# Patient Record
Sex: Male | Born: 1980
Health system: Southern US, Academic
[De-identification: ages and names within clinical notes are randomized; demographics above are authoritative.]

## PROBLEM LIST (undated history)

## (undated) ENCOUNTER — Ambulatory Visit
Payer: MEDICARE | Attending: Rehabilitative and Restorative Service Providers" | Primary: Rehabilitative and Restorative Service Providers"

## (undated) ENCOUNTER — Encounter

## (undated) ENCOUNTER — Encounter: Attending: Neurology | Primary: Neurology

## (undated) ENCOUNTER — Ambulatory Visit

## (undated) ENCOUNTER — Telehealth

## (undated) ENCOUNTER — Ambulatory Visit: Payer: MEDICARE

## (undated) ENCOUNTER — Ambulatory Visit: Payer: Medicare (Managed Care) | Attending: Neurology | Primary: Neurology

## (undated) ENCOUNTER — Ambulatory Visit: Payer: MEDICARE | Attending: Neurology | Primary: Neurology

## (undated) ENCOUNTER — Ambulatory Visit: Payer: Medicare (Managed Care)

## (undated) ENCOUNTER — Other Ambulatory Visit

## (undated) ENCOUNTER — Telehealth: Attending: Neurology | Primary: Neurology

## (undated) ENCOUNTER — Ambulatory Visit: Payer: MEDICARE | Attending: Ophthalmology | Primary: Ophthalmology

## (undated) ENCOUNTER — Ambulatory Visit: Attending: Addiction (Substance Use Disorder) | Primary: Addiction (Substance Use Disorder)

## (undated) ENCOUNTER — Encounter: Attending: Addiction (Substance Use Disorder) | Primary: Addiction (Substance Use Disorder)

## (undated) ENCOUNTER — Telehealth: Attending: Clinical | Primary: Clinical

## (undated) ENCOUNTER — Ambulatory Visit: Attending: Pharmacist | Primary: Pharmacist

---
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
## 2007-03-14 ENCOUNTER — Emergency Department: Payer: Self-pay | Admitting: Emergency Medicine
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
## 2017-06-04 ENCOUNTER — Emergency Department
Admission: EM | Admit: 2017-06-04 | Discharge: 2017-06-04 | Disposition: A | Discharge status: Home / Self Care | Payer: MEDICARE | Source: Intra-hospital

## 2017-06-21 ENCOUNTER — Ambulatory Visit
Admission: RE | Admit: 2017-06-21 | Discharge: 2017-06-21 | Payer: MEDICARE | Attending: Student in an Organized Health Care Education/Training Program | Admitting: Student in an Organized Health Care Education/Training Program

## 2017-06-21 DIAGNOSIS — G43709 Chronic migraine without aura, not intractable, without status migrainosus: Principal | ICD-10-CM

## 2017-06-21 DIAGNOSIS — M542 Cervicalgia: Secondary | ICD-10-CM

## 2017-06-21 DIAGNOSIS — G8929 Other chronic pain: Secondary | ICD-10-CM

## 2017-06-21 DIAGNOSIS — I10 Essential (primary) hypertension: Secondary | ICD-10-CM

## 2017-06-21 MED ORDER — NAPROXEN 500 MG TABLET,DELAYED RELEASE
ORAL_TABLET | Freq: Two times a day (BID) | ORAL | 6 refills | 0 days | Status: CP
Start: 2017-06-21 — End: 2017-08-08

## 2017-06-21 MED ORDER — RIZATRIPTAN 10 MG DISINTEGRATING TABLET
ORAL_TABLET | Freq: Once | ORAL | 3 refills | 0 days | Status: CP | PRN
Start: 2017-06-21 — End: 2017-08-08

## 2017-06-21 MED ORDER — PROPRANOLOL 40 MG TABLET
ORAL_TABLET | Freq: Two times a day (BID) | ORAL | 3 refills | 0 days | Status: CP
Start: 2017-06-21 — End: 2017-08-08

## 2017-06-26 ENCOUNTER — Emergency Department
Admission: EM | Admit: 2017-06-26 | Discharge: 2017-06-26 | Disposition: A | Discharge status: Home / Self Care | Source: Intra-hospital

## 2017-07-08 ENCOUNTER — Emergency Department
Admission: EM | Admit: 2017-07-08 | Discharge: 2017-07-08 | Disposition: A | Discharge status: Home / Self Care | Payer: MEDICARE | Source: Intra-hospital | Attending: Family | Admitting: Family

## 2017-08-02 ENCOUNTER — Emergency Department
Admission: EM | Admit: 2017-08-02 | Discharge: 2017-08-02 | Disposition: A | Discharge status: Home / Self Care | Payer: MEDICARE | Source: Intra-hospital | Attending: Registered Nurse | Admitting: Registered Nurse

## 2017-08-08 ENCOUNTER — Ambulatory Visit
Admission: RE | Admit: 2017-08-08 | Discharge: 2017-08-08 | Attending: Student in an Organized Health Care Education/Training Program

## 2017-08-08 DIAGNOSIS — I10 Essential (primary) hypertension: Secondary | ICD-10-CM

## 2017-08-08 DIAGNOSIS — G8929 Other chronic pain: Secondary | ICD-10-CM

## 2017-08-08 DIAGNOSIS — M542 Cervicalgia: Secondary | ICD-10-CM

## 2017-08-08 DIAGNOSIS — G43709 Chronic migraine without aura, not intractable, without status migrainosus: Principal | ICD-10-CM

## 2017-08-08 MED ORDER — RIZATRIPTAN 10 MG DISINTEGRATING TABLET
ORAL_TABLET | Freq: Once | ORAL | 3 refills | 0.00000 days | Status: CP | PRN
Start: 2017-08-08 — End: 2017-09-25

## 2017-08-08 MED ORDER — PROPRANOLOL 80 MG TABLET
ORAL_TABLET | Freq: Two times a day (BID) | ORAL | 3 refills | 0 days | Status: SS
Start: 2017-08-08 — End: 2017-12-07

## 2017-08-08 MED ORDER — NAPROXEN 500 MG TABLET,DELAYED RELEASE
ORAL_TABLET | Freq: Two times a day (BID) | ORAL | 6 refills | 0 days | Status: CP
Start: 2017-08-08 — End: 2017-09-30

## 2017-08-16 ENCOUNTER — Emergency Department
Admission: EM | Admit: 2017-08-16 | Discharge: 2017-08-16 | Disposition: A | Discharge status: Home / Self Care | Payer: MEDICARE | Source: Intra-hospital

## 2017-08-28 ENCOUNTER — Emergency Department
Admission: EM | Admit: 2017-08-28 | Discharge: 2017-08-29 | Disposition: A | Discharge status: Home / Self Care | Payer: MEDICARE | Source: Intra-hospital | Attending: Emergency Medicine | Admitting: Emergency Medicine

## 2017-09-25 ENCOUNTER — Ambulatory Visit
Admission: RE | Admit: 2017-09-25 | Discharge: 2017-09-25 | Disposition: A | Discharge status: Home / Self Care | Payer: MEDICARE | Attending: Neurology | Admitting: Neurology

## 2017-09-25 ENCOUNTER — Ambulatory Visit
Admission: RE | Admit: 2017-09-25 | Discharge: 2017-09-25 | Disposition: A | Discharge status: Home / Self Care | Payer: MEDICARE

## 2017-09-25 DIAGNOSIS — G43709 Chronic migraine without aura, not intractable, without status migrainosus: Principal | ICD-10-CM

## 2017-09-25 DIAGNOSIS — D649 Anemia, unspecified: Secondary | ICD-10-CM

## 2017-09-25 DIAGNOSIS — I10 Essential (primary) hypertension: Secondary | ICD-10-CM

## 2017-09-25 DIAGNOSIS — G8929 Other chronic pain: Secondary | ICD-10-CM

## 2017-09-25 DIAGNOSIS — M542 Cervicalgia: Secondary | ICD-10-CM

## 2017-09-25 DIAGNOSIS — F79 Unspecified intellectual disabilities: Secondary | ICD-10-CM

## 2017-09-25 MED ORDER — RIZATRIPTAN 10 MG DISINTEGRATING TABLET
ORAL_TABLET | Freq: Once | ORAL | 3 refills | 0 days | Status: CP | PRN
Start: 2017-09-25 — End: 2017-12-29

## 2017-09-25 MED ORDER — METHYLPREDNISOLONE 4 MG TABLETS IN A DOSE PACK
PACK | 0 refills | 0 days | Status: CP
Start: 2017-09-25 — End: 2017-12-07

## 2017-09-25 MED ORDER — ONDANSETRON 8 MG DISINTEGRATING TABLET
ORAL_TABLET | Freq: Three times a day (TID) | ORAL | 2 refills | 0 days | Status: CP | PRN
Start: 2017-09-25 — End: 2017-10-25

## 2017-09-25 MED ORDER — TOPIRAMATE 50 MG TABLET
ORAL_TABLET | 0 refills | 0 days | Status: CP
Start: 2017-09-25 — End: 2017-12-04

## 2017-09-29 ENCOUNTER — Ambulatory Visit
Admission: RE | Admit: 2017-09-29 | Discharge: 2017-09-29 | Payer: MEDICARE | Attending: Neurology | Admitting: Neurology

## 2017-09-29 DIAGNOSIS — G43709 Chronic migraine without aura, not intractable, without status migrainosus: Principal | ICD-10-CM

## 2017-09-30 ENCOUNTER — Emergency Department
Admission: EM | Admit: 2017-09-30 | Discharge: 2017-09-30 | Disposition: A | Discharge status: Home / Self Care | Payer: MEDICARE | Source: Intra-hospital | Attending: Emergency Medicine | Admitting: Emergency Medicine

## 2017-09-30 ENCOUNTER — Emergency Department
Admission: EM | Admit: 2017-09-30 | Discharge: 2017-09-30 | Disposition: A | Discharge status: Home / Self Care | Payer: MEDICARE | Source: Intra-hospital

## 2017-09-30 DIAGNOSIS — M545 Low back pain: Principal | ICD-10-CM

## 2017-09-30 MED ORDER — NAPROXEN 500 MG TABLET
ORAL_TABLET | Freq: Two times a day (BID) | ORAL | 0 refills | 0.00000 days | Status: CP
Start: 2017-09-30 — End: 2017-10-15

## 2017-09-30 MED ORDER — TIZANIDINE 2 MG TABLET
ORAL_TABLET | Freq: Four times a day (QID) | ORAL | 0 refills | 0.00000 days | Status: CP | PRN
Start: 2017-09-30 — End: 2017-10-06

## 2017-10-06 ENCOUNTER — Ambulatory Visit: Admission: RE | Admit: 2017-10-06 | Discharge: 2017-10-06 | Payer: MEDICARE | Attending: Internal Medicine

## 2017-10-06 DIAGNOSIS — G8929 Other chronic pain: Secondary | ICD-10-CM

## 2017-10-06 DIAGNOSIS — H538 Other visual disturbances: Secondary | ICD-10-CM

## 2017-10-06 DIAGNOSIS — M542 Cervicalgia: Secondary | ICD-10-CM

## 2017-10-06 DIAGNOSIS — G43709 Chronic migraine without aura, not intractable, without status migrainosus: Secondary | ICD-10-CM

## 2017-10-06 DIAGNOSIS — I10 Essential (primary) hypertension: Principal | ICD-10-CM

## 2017-10-06 MED ORDER — TIZANIDINE 2 MG TABLET
ORAL_TABLET | Freq: Four times a day (QID) | ORAL | 0 refills | 0 days | Status: CP | PRN
Start: 2017-10-06 — End: 2017-12-07

## 2017-10-06 MED ORDER — CHLORTHALIDONE 25 MG TABLET
ORAL_TABLET | Freq: Every morning | ORAL | 3 refills | 0 days | Status: CP
Start: 2017-10-06 — End: 2017-10-27

## 2017-10-06 MED ORDER — TIZANIDINE 2 MG TABLET: 2 mg | tablet | Freq: Four times a day (QID) | 0 refills | 0 days | Status: AC

## 2017-10-08 ENCOUNTER — Emergency Department
Admission: EM | Admit: 2017-10-08 | Discharge: 2017-10-08 | Disposition: A | Discharge status: Home / Self Care | Payer: MEDICARE | Source: Intra-hospital | Attending: Family | Admitting: Family

## 2017-10-22 ENCOUNTER — Emergency Department
Admission: EM | Admit: 2017-10-22 | Discharge: 2017-10-22 | Disposition: A | Discharge status: Home / Self Care | Payer: MEDICARE | Source: Intra-hospital | Attending: Emergency Medicine

## 2017-10-23 MED ORDER — CYCLOBENZAPRINE 10 MG TABLET
ORAL_TABLET | 0 refills | 0 days | Status: CP
Start: 2017-10-23 — End: 2017-12-04

## 2017-10-25 MED ORDER — TOPIRAMATE 100 MG TABLET
ORAL_TABLET | Freq: Two times a day (BID) | ORAL | 6 refills | 0 days | Status: SS
Start: 2017-10-25 — End: 2017-12-07

## 2017-10-27 ENCOUNTER — Ambulatory Visit
Admission: RE | Admit: 2017-10-27 | Discharge: 2017-10-27 | Disposition: A | Discharge status: Home / Self Care | Payer: MEDICARE

## 2017-10-27 ENCOUNTER — Ambulatory Visit
Admission: RE | Admit: 2017-10-27 | Discharge: 2017-10-27 | Disposition: A | Discharge status: Home / Self Care | Payer: MEDICARE | Attending: Internal Medicine | Admitting: Internal Medicine

## 2017-10-27 DIAGNOSIS — G43709 Chronic migraine without aura, not intractable, without status migrainosus: Secondary | ICD-10-CM

## 2017-10-27 DIAGNOSIS — M542 Cervicalgia: Secondary | ICD-10-CM

## 2017-10-27 DIAGNOSIS — I10 Essential (primary) hypertension: Secondary | ICD-10-CM

## 2017-10-27 DIAGNOSIS — G8929 Other chronic pain: Secondary | ICD-10-CM

## 2017-10-27 MED ORDER — PROMETHAZINE 25 MG TABLET
ORAL_TABLET | Freq: Four times a day (QID) | ORAL | 0 refills | 0.00000 days | Status: CP | PRN
Start: 2017-10-27 — End: 2017-12-15

## 2017-10-27 MED ORDER — KETOROLAC 10 MG TABLET
ORAL_TABLET | Freq: Four times a day (QID) | ORAL | 0 refills | 0 days | Status: CP | PRN
Start: 2017-10-27 — End: 2017-12-07

## 2017-10-27 MED ORDER — CHLORTHALIDONE 25 MG TABLET
ORAL_TABLET | Freq: Every morning | ORAL | 11 refills | 0.00000 days | Status: CP
Start: 2017-10-27 — End: 2017-12-07

## 2017-10-27 MED ORDER — DIPHENHYDRAMINE 25 MG CAPSULE
ORAL_CAPSULE | ORAL | 0 refills | 0 days | Status: CP | PRN
Start: 2017-10-27 — End: ?

## 2017-12-02 MED ORDER — SULFAMETHOXAZOLE 800 MG-TRIMETHOPRIM 160 MG TABLET
ORAL_TABLET | Freq: Two times a day (BID) | ORAL | 0 refills | 0.00000 days | Status: CP
Start: 2017-12-02 — End: 2017-12-07

## 2017-12-04 ENCOUNTER — Ambulatory Visit
Admit: 2017-12-04 | Discharge: 2017-12-07 | Disposition: A | Discharge status: Home / Self Care | Payer: MEDICARE | Admitting: Hospitalist

## 2017-12-04 DIAGNOSIS — L0390 Cellulitis, unspecified: Principal | ICD-10-CM

## 2017-12-04 NOTE — Unmapped (Signed)
Emergency Department Provider Note        ED Clinical Impression     Final diagnoses:   Cellulitis, unspecified cellulitis site (Primary)       ED Assessment/Plan     37 y.o. male with a PMH of HTN who presents to the ED with an abscess for 5 days. He has been taking bactrim with no relief. No difficulty breathing, swallowing, or airway involvement. Triage VS WNL. On exam, patient with a 5x8 cm area on right side of neck. No induration or erythema. Not warm to the touch. No drooling, stridor, or raised tongue. Will obtain bas labs, lactic acid, and CT neck.     4:23 PM  Ct Neck shows right submandibular subcutaneous collection consistent with an abscess with surrounding cellulitis. Reactive submandibular adenopathy. No involvement of the underlying bone.     5:37 PM  Spoke with Oral Maxillofacial Surgery.     6:10 PM  Updated patient on findings. Plan to admit for continued IV antibiotics. He is agreeable with plan. Paged MAO.     6:46 PM  Spoke with the Medical Admissions Officer regarding the presentation of this patient. They will contact and find appropriate disposition for the patient to an available medical service for admission in order to continue further treatment and evaluation at the suitable level of care    2044 - patient vss, no airway compromise, since patient failed outpatient antibiotic treatment, will admit for IV abx.  Patient stable at transport.      History     Chief Complaint   Patient presents with   ??? Abscess     HPI     Glenn Kennedy is a 37 y.o. male with a PMH of HTN who presents to the ED with an abscess for 5 days. On review of records, patient was seen in the ED 2 days ago (12/02/17) with a similar complaint. I&D with stab incision was performed. Patient was discharged with bactrim.     Since then, patient complains of worsening pain to the area. He describes pain as an 8/10 sharp, aching pain. Nothing improves or worsens pain. He reports first noticing a small area near the right side of his chin which has progressively increased in pain and size. Patient reports taking his previously prescribed antibiotic with no relief. Denies fever, chills, shortness of breath, difficulty swallowing or tolerating secretions, voice changes, or history of MRSA. Denies recent travel or dental problem. Denies history of IV drug use. Endorses smoking cigarettes.     Past Medical History:   Diagnosis Date   ??? Hypertension    ??? Migraines    ??? Migraines    ??? Neck pain, chronic        No past surgical history on file.    Family History   Problem Relation Age of Onset   ??? Stroke Mother 86        hemorrhagic   ??? Hyperlipidemia Mother    ??? Hypertension Mother    ??? Migraines Mother    ??? No Known Problems Father    ??? No Known Problems Brother    ??? Migraines Son    ??? No Known Problems Maternal Grandmother    ??? No Known Problems Maternal Grandfather        Social History     Social History   ??? Marital status: Single     Spouse name: N/A   ??? Number of children: N/A   ??? Years of education:  N/A     Social History Main Topics   ??? Smoking status: Current Every Day Smoker     Packs/day: 0.50     Years: 15.00     Types: Cigarettes     Start date: 06/01/1997   ??? Smokeless tobacco: Never Used   ??? Alcohol use No   ??? Drug use: No   ??? Sexual activity: Not Asked     Other Topics Concern   ??? None     Social History Narrative   ??? None       Review of Systems   Constitutional: Negative for chills and fever.   HENT: Negative for trouble swallowing and voice change.    Respiratory: Negative for shortness of breath.    Musculoskeletal: Positive for neck pain.   Skin:        Positive for abscess to right neck.    All other systems reviewed and are negative.      Physical Exam     BP 118/76  - Pulse 88  - Temp 35.7 ??C (96.3 ??F) (Oral)  - Resp 18  - SpO2 98%     Physical Exam   Constitutional: He is oriented to person, place, and time. He appears well-developed and well-nourished. No distress.   HENT:   Head: Normocephalic and atraumatic. 5x8 cm area on right side of neck. No induration or erythema. Not warm to the touch. No drooling, stridor, or raised tongue.    Eyes: Conjunctivae and EOM are normal.   Neck: Normal range of motion.   Pulmonary/Chest: Effort normal. No stridor. No respiratory distress.   Musculoskeletal: Normal range of motion.   Neurological: He is alert and oriented to person, place, and time. Coordination normal.   Skin: Skin is warm and dry. No rash noted. No pallor.   Psychiatric: He has a normal mood and affect. His behavior is normal.   Nursing note and vitals reviewed.      ED Course     Ct Neck Soft Tissue W Contrast    -Right submandibular subcutaneous collection consistent with an abscess with surrounding cellulitis. Reactive submandibular adenopathy. No involvement of the underlying bone.   -Locule of gas within the collection, likely iatrogenic. If no injection/intervention has been performed, this would be concerning for gas-forming organism.          Coding     Documentation assistance was provided by Jobe Marker, Scribe, on December 04, 2017 at 3:06 PM for Tomasita Morrow, [place].       Scribe's Attestation: The NP/PA or resident/attending physician obtained and performed the history, physical exam and medical decision making elements that were entered into the chart by me.  Signed by Kem Parkinson, Scribe,  on December 04, 2017 at 7:46 PM.        Rozelle Logan, [place]  12/04/17 2049

## 2017-12-05 LAB — CBC W/ AUTO DIFF
BASOPHILS ABSOLUTE COUNT: 0 10*9/L (ref 0.0–0.1)
EOSINOPHILS ABSOLUTE COUNT: 0.2 10*9/L (ref 0.0–0.4)
HEMATOCRIT: 40.1 % — ABNORMAL LOW (ref 41.0–53.0)
HEMOGLOBIN: 13.9 g/dL (ref 13.5–17.5)
LARGE UNSTAINED CELLS: 3 % (ref 0–4)
LYMPHOCYTES ABSOLUTE COUNT: 2.9 10*9/L (ref 1.5–5.0)
MEAN CORPUSCULAR HEMOGLOBIN: 31.4 pg (ref 26.0–34.0)
MEAN CORPUSCULAR VOLUME: 90.7 fL (ref 80.0–100.0)
MEAN PLATELET VOLUME: 9.1 fL (ref 7.0–10.0)
MONOCYTES ABSOLUTE COUNT: 0.4 10*9/L (ref 0.2–0.8)
NEUTROPHILS ABSOLUTE COUNT: 5.1 10*9/L (ref 2.0–7.5)
PLATELET COUNT: 189 10*9/L (ref 150–440)
RED BLOOD CELL COUNT: 4.42 10*12/L — ABNORMAL LOW (ref 4.50–5.90)
WBC ADJUSTED: 8.9 10*9/L (ref 4.5–11.0)

## 2017-12-05 LAB — COMPREHENSIVE METABOLIC PANEL
ALBUMIN: 4.1 g/dL (ref 3.5–5.0)
ALKALINE PHOSPHATASE: 144 U/L — ABNORMAL HIGH (ref 38–126)
ALT (SGPT): 40 U/L (ref 19–72)
ANION GAP: 10 mmol/L (ref 9–15)
AST (SGOT): 30 U/L (ref 19–55)
BILIRUBIN TOTAL: 0.3 mg/dL (ref 0.0–1.2)
BLOOD UREA NITROGEN: 12 mg/dL (ref 7–21)
BUN / CREAT RATIO: 8
CALCIUM: 9.2 mg/dL (ref 8.5–10.2)
CO2: 23 mmol/L (ref 22.0–30.0)
CREATININE: 1.55 mg/dL — ABNORMAL HIGH (ref 0.70–1.30)
EGFR MDRD AF AMER: >=60 mL/min/{1.73_m2} (ref >=60)
EGFR MDRD NON AF AMER: 51 mL/min/{1.73_m2} — ABNORMAL LOW (ref >=60)
GLUCOSE RANDOM: 113 mg/dL (ref 65–179)
POTASSIUM: 3.5 mmol/L (ref 3.5–5.0)
PROTEIN TOTAL: 7 g/dL (ref 6.5–8.3)
SODIUM: 141 mmol/L (ref 135–145)

## 2017-12-05 LAB — OSMOLALITY URINE: Lab: 400

## 2017-12-05 LAB — ESTIMATED AVERAGE GLUCOSE: Estimated average glucose:MCnc:Pt:Bld:Qn:Estimated from glycated hemoglobin: 111

## 2017-12-05 LAB — CHLORIDE: Chloride:SCnc:Pt:Ser/Plas:Qn:: 108 — ABNORMAL HIGH

## 2017-12-05 LAB — SODIUM URINE: Lab: 93

## 2017-12-05 LAB — MEAN PLATELET VOLUME: Lab: 9.1

## 2017-12-05 LAB — CREATININE, URINE: Lab: 85.9

## 2017-12-05 MED ORDER — GALCANEZUMAB-GNLM 120 MG/ML SUBCUTANEOUS PEN INJECTOR: mL | 3 refills | 0 days | Status: AC

## 2017-12-05 MED ORDER — GALCANEZUMAB-GNLM 120 MG/ML SUBCUTANEOUS PEN INJECTOR: 120 mg | mL | 3 refills | 0 days | Status: AC

## 2017-12-05 MED ORDER — GALCANEZUMAB-GNLM 120 MG/ML SUBCUTANEOUS PEN INJECTOR
Freq: Once | SUBCUTANEOUS | 0 refills | 0.00000 days | Status: CP
Start: 2017-12-05 — End: 2018-12-21

## 2017-12-05 MED ORDER — GALCANEZUMAB-GNLM 120 MG/ML SUBCUTANEOUS PEN INJECTOR: 240 mg | mL | Freq: Once | 0 refills | 0 days

## 2017-12-05 NOTE — Unmapped (Signed)
Report given to Chriss Czar RN.

## 2017-12-05 NOTE — Unmapped (Signed)
Problem: Patient Care Overview  Goal: Plan of Care Review  Outcome: Progressing  Pt started on IV ABX, no reactions noted. Pt denies any pain. Will continue to monitor.    Problem: Skin and Soft Tissue Infection (Adult)  Intervention: Monitor/Manage Pain   12/05/17 0254   Interventions   Medication Review/Management infusion initiated   Pain Management Interventions medication offered but refused;relaxation techniques promoted

## 2017-12-05 NOTE — Unmapped (Signed)
Problem: Patient Care Overview  Goal: Plan of Care Review  Outcome: Progressing  Pt progressing towards goals. Tolerating IV antibiotics. Denies pain. Pt remains with swelling in his right neck.          Denies needs/questions at this time.

## 2017-12-05 NOTE — Unmapped (Signed)
Update to Chart review: Patient known to me with chronic migraine, care limited by intellectual challenges as well as limited resources. Multiple ED visits, despite our efforts at addressing all headache concerns as outpatient in the [facility].     I ordered Botox, but that has not come through. Will try to obtain monoclonal antibody (GCRPreceptor) for migraine, given the higher likelihood of financial aid. Will try for Botox again as  well.

## 2017-12-05 NOTE — Unmapped (Signed)
Medicine History and Physical    Assessment/Plan:    Principal Problem:    Submandibular swelling  Active Problems:    Chronic migraine without aura without status migrainosus, not intractable    Hypertension    Neck pain, chronic    AKI (acute kidney injury) (CMS-HCC)  Resolved Problems:    * No resolved hospital problems. *      Glenn Kennedy is a 37 y.o. male with PMHx as reviewed in the EMR who presented to [facility] with Submandibular swelling.    Cellulitis/ early submandibular abscess-failed outpatient abx, without systemic involvement/bacteremia or evidence of airway compromise, no drainage/surg indicated per OMFS and ENT based on current clinical status and CT imaging.   -s/p cefazolin x1  -transition to unasyn x7 d, consider transition to orals in coming days  -oxycodone and morphine prn for neck pain  -monitor for airway compromise.     CKD- likely in setting of HTN  -chlorthalidone 12.5 mgqd    HTN- stable- continue chlorthalidone    Migraine - chronic, stable  -continue bid propranolol, bid topiramate, prn phenergan and maxalt.     Tobacco use- nicotine patch     FEN-reg  GI-none  DVT- full ambulatory   Code-full code  Dispo, floor       ___________________________________________________________________    Chief Complaint:  Chief Complaint   Patient presents with   ??? Abscess     Submandibular swelling    HPI:  Glenn Kennedy is a 37 y.o. male with PMHx as reviewed in the EMR who presented to [facility] with Submandibular swelling.    He reports that he was in his usual state of health until middle of last week when he started noticing a swelling/fullness below his chin.  Over the next several days it continued to grow in size and became erythematous in overlying skin.  He reports he has never had any infection like this and reported no recent trauma or injury to his neck.  He sought care at the emergency department on 1/12 where he underwent an incision and drainage with expression of only blood.  He was started on Bactrim with plan for follow-up, however his pain persisted. Other than his neck pain, he feels that he is at his baseline health.  He reports that the pain is sharp and severe and makes it difficult for him to sleep.  However he reports no dyspnea or dysphasia, voice changes or drooling,  fevers chills or additional rash, chest pain, nausea vomiting diarrhea, abdominal pain, hematuria or dysuria.  He is accompanied by his parents.    Allergies:  Patient has no known allergies.    Medications:   Prior to Admission medications    Medication Dose, Route, Frequency   chlorthalidone (HYGROTON) 25 MG tablet 12.5 mg, Oral, Every morning   cyclobenzaprine (FLEXERIL) 10 MG tablet No dose, route, or frequency recorded.   diphenhydrAMINE (BENADRYL) 25 mg capsule 25 mg, Oral, Every 4 hours PRN   ketorolac (TORADOL) 10 mg tablet 10 mg, Oral, Every 6 hours PRN   methylPREDNISolone (MEDROL DOSEPACK) 4 mg tablet follow package directions   promethazine (PHENERGAN) 25 MG tablet 25 mg, Oral, Every 6 hours PRN   propranolol (INDERAL) 80 MG tablet 80 mg, Oral, 2 times a day (standard)   rizatriptan (MAXALT-MLT) 10 MG disintegrating tablet 10 mg, Oral, Once as needed, May repeat in 2 hours if needed   sulfamethoxazole-trimethoprim (BACTRIM DS) 800-160 mg per tablet 1 tablet, Oral, 2 times a  day (standard)   tiZANidine (ZANAFLEX) 2 MG tablet 2 mg, Oral, Every 6 hours PRN   topiramate (TOPAMAX) 100 MG tablet 100 mg, Oral, 2 times a day (standard)       Medical History:  Past Medical History:   Diagnosis Date   ??? Hypertension    ??? Migraines    ??? Migraines    ??? Neck pain, chronic        Surgical History:  No past surgical history on file.    Social History:  Social History     Social History   ??? Marital status: Single     Spouse name: N/A   ??? Number of children: N/A   ??? Years of education: N/A     Occupational History   ??? Not on file.     Social History Main Topics   ??? Smoking status: Current Every Day Smoker     Packs/day: 0.50 Years: 15.00     Types: Cigarettes     Start date: 06/01/1997   ??? Smokeless tobacco: Never Used   ??? Alcohol use No   ??? Drug use: No   ??? Sexual activity: Not on file     Other Topics Concern   ??? Not on file     Social History Narrative   ??? No narrative on file     No reported alcohol use, no illicit drug use.  Reports smoking 1 pack/day since high school.      Family History:  Family History   Problem Relation Age of Onset   ??? Stroke Mother 59        hemorrhagic   ??? Hyperlipidemia Mother    ??? Hypertension Mother    ??? Migraines Mother    ??? No Known Problems Father    ??? No Known Problems Brother    ??? Migraines Son    ??? No Known Problems Maternal Grandmother    ??? No Known Problems Maternal Grandfather        Review of Systems:  10 systems reviewed and are negative unless otherwise mentioned in HPI    Labs/Studies:  Labs and Studies from the last 24hrs per EMR and Reviewed    Physical Exam:  Temp:  [35.7 ??C-36.3 ??C] 35.7 ??C  Heart Rate:  [88-92] 88  Resp:  [17-18] 18  BP: (118-144)/(76-77) 118/76  SpO2:  [98 %-99 %] 98 %    GEN - Well appearing male,  cooperative, no acute distress  HEENT- NCAT, conjunctivae/corneas clear. PERRL, EOM's intact. No icterus. External ears normal, mucosa normal, many teeth missing in the uppers, poor dentition in the lowers.  No oropharyngeal erythema or exudate.,  Mallampati 1, no swelling of the tongue.,  Full range of motion of the neck.  Neck -5 x 8 cm mass on the right submandibular area, mild overlying erythema, no warmth pictures are in the media tab  Lungs -  Normal work of breathing, Lungs clear to auscultation bilaterally, no adventitious sounds  Heart - regular rate and rhythm, no Murmurs, rubs, gallops, no jvd, peripheral pulses normal.  Abdomen - Abdomen soft, non-tender, non distended. BS normal. No masses, organomegaly. No CVA tenderness.  Extremities -  Extremities normal. No deformities, edema, or skin discoloration  Musculoskeletal - Spine ROM normal. Back symmetric, no curvature. ROM normal.  Muscular strength grossly intact.  Skin - Skin color, texture, turgor normal.  Abscess as above  Neuro - alert and oriented, CN2-12 intact grossly,  Psych- mood and affect normal

## 2017-12-05 NOTE — Unmapped (Signed)
Admit md here to eval pt.

## 2017-12-05 NOTE — Unmapped (Signed)
Patient has been identified as a Runner, broadcasting/film/video.  Requested that a  timely follow-up appointment be scheduled and documented prior to discharge.  Longitudinal Plan of Care reviewed, the following information is noted for continuity of care:     Home Safety Screening  Patient Questions:  1.  Before the illness or injury that brought you to the ED, did you need someone to help you on a regular basis?   ?? No  2.  In the last 24 hours, have you needed more help than usual?  ?? No  3.  Have you been hospitalized for one or more nights during the past six months?   ?? No  4.  Have you been to an emergency room one or more times in the past six months?  ?? Yes - 10 times. 8 of those were with c/o headaches.  5.  In general, do you have problems with your vision that cannot be corrected with glasses/contacts?  ?? No  6.  In general, do you have problems with your memory?  ?? No  7.  Do you take six or more different medications every day?  ?? No - most of his meds are prescribed PRN.  8.  Have you had any falls over the past few weeks?  ?? No  9.  Are you having trouble accessing and/or preparing the meals you need?  ?? No  10. Do you have wounds or bed ulcers?  ?? Yes - facial abscess\  Care Manager Questions:  Is the patient over the age of 77?  ?? No  *Home Safety Evaluation endorsed  2 of the screening items and   will update personal health advocate.    Personal Health Advocate Pool contacted by In Basket message regarding post-acute services and collaboration around plan of care.    When patient/family asked, Were the services I provided/discussed with you today of value?, patient/family responded Y/N? Yes -          Care Management  Initial Transition Planning Assessment              General  Care Manager assessed the patient by : In person interview with patient, Medical record review  Orientation Level: Oriented X4  Who provides care at home?: N/A (support from family with transportation. Reports he needs no ADL assistance.)    Contact/Decision Maker:  Primary Contact Name: Glenn Kennedy  Primary Contact Relationship: Legal Guardian, Mother  Phone #1: [phone]    Advance Directives (completed by nursing)  Advance Directive (Medical Treatment)  Does patient have an advance directive covering medical treatment?: Patient does not have advance directive covering medical treatment., Patient would not like information.  Reason patient does not have an advance directive covering medical treatment:: Patient does not wish to complete one at this time  Surrogate decision maker appointed:: Other (Comment)  Surrogate decision maker's name:: Glenn Kennedy  Surrogate decision maker's phone number:: [phone]  Information provided on advance directive:: Yes  Patient requests assistance:: No    Patient Information:    Lives with: Parents and child    Type of Residence: Private residence  Location/Detail: [address] [place] [address].    Support Systems: Family Members, Parent, Comment (Pt and his 31 y.o. son live with pt's parents.)    Responsibilities/Dependents at home?: Yes (Describe) (self-care and care of his son. Pt disabled per SSA guidelines.)    Home Care services in place prior to admission?: No  Outpatient/Community Resources in place prior to admission: Clinic  Agency detail (Name/Phone #): PCP: Roma Schanz, [facility], [facility] / [phone]    Equipment Currently Used at Home: none    Currently receiving outpatient dialysis?: No    Financial Information:     Glenn Kennedy.    Need for financial assistance?: No    Discharge Needs Assessment:    Concerns to be Addressed: discharge planning (TBD)    Clinical Risk Factors: Multiple Diagnoses (Chronic)    Barriers to taking medications: No    Prior overnight hospital stay or ED visit in last 90 days: Yes (10 other ED visits in last 6 months. 8 of those were with c/o headaches.)    Anticipated Changes Related to Illness: none Equipment Needed After Discharge: none    Discharge Facility/Level of Care Needs:      Patient at risk for readmission?: Other (Comment) ([facility] CM is actively attempting to guide pt to clinics instead of ED.)    Discharge Plan:    Screen findings are: Discharge planning needs identified or anticipated (Comment). TBD.    Expected Discharge Date: 12/07/17    Expected Transfer from Critical Care:  N/A    Patient and/or family were provided with choice of facilities / services that are available and appropriate to meet post hospital care needs?: Other (Comment) (not yet. Dispo needs still being assessed.)    Initial Assessment complete?: Yes    Glenn Ro, LCSW, Inpatient Case Manager

## 2017-12-06 NOTE — Unmapped (Signed)
Problem: Patient Care Overview  Goal: Plan of Care Review  Continues on IV abx therapy for abscess on submandibular.Failed po abx outpt so receiving Iv and then will transition back to po.No c/o pain.VSS afebrile.    Problem: Skin and Soft Tissue Infection (Adult)  Intervention: Provide Wound Management and Care   12/06/17 1341   Interventions   Pressure Reduction Techniques frequent weight shift encouraged

## 2017-12-06 NOTE — Unmapped (Signed)
Antibiotic Timeout Daily Checklist  Indication for antibiotics: Abscess/cellulitis  Antibiotic Start Date: 1/14  Current systemic antibiotics: unasyn  Microbiology Results: na  Sensitivities Available? no  Are antibiotics still indicated? YES  Is it appropriate to de-escalate? NA: Antibiotics already de-escalated  Is it appropriate to convert to PO therapy? YES  Today's antibiotic plan: Change antibiotics to Augmentin   Planned Antibiotic Duration: 7-10 days

## 2017-12-06 NOTE — Unmapped (Signed)
Medicine Progress Note    Interval History/Subjective:  Doing well this morning.  Pain associated with the submandibular cellulitis/fluid collection is the same, there has been no improvement, but overall it is mild.  Area appears more fluctuant today.  We will repeat a bedside ultrasound to reassess the fluid collection.  No other issues.      Assessment/Plan:  Principal Problem:    Submandibular swelling  Active Problems:    Chronic migraine without aura without status migrainosus, not intractable    Hypertension    Neck pain, chronic    AKI (acute kidney injury) (CMS-HCC)  Resolved Problems:    * No resolved hospital problems. *    Glenn Kennedy is a 37 y.o. male with migraines and HTN who presented to [facility] with Submandibular swelling c/w cellulitis and early fluid collection.    Cellulitis/ early submandibular abscess-failed outpatient bactrim and I&D on 1/12 (reportedly blood drainage without purulence).  No systemic involvement/bacteremia, airway compromise.  No drainage/surg indicated per OMFS and ENT based on current clinical status and CT imaging.  Bedside ultrasound from 1/15 consistent with small fluid collection without evidence of abscess formation.    -s/p cefazolin x1  -transition to unasyn x7 d, transition to orals tomorrow, if not improving may add MRSA coverage  -oxycodone and morphine prn for neck pain  -monitor for airway compromise    AKI- likely medication related - on home naproxen, Ketoralac, chlorthalidone and recently on bactrim.  -hold home chlorthalidone 12.5 qd  -counsel patient on advoiding NSAIDs    HTN-  -hold home chlorthalidone, see above  -consider starting amlodipine    Migraine - chronic, stable  -continue bid propranolol, bid topiramate, prn phenergan and maxalt.     Tobacco use- nicotine patch and lozenges    FEN-reg  GI-none  DVT- full ambulatory   Code-full code  Dispo- inpatient status, [facility] ___________________________________________________________________ Labs/Studies:  Labs and Studies from the last 24hrs per EMR and Reviewed    Physical Exam:  Temp:  [35.8 ??C-36.6 ??C] 36.6 ??C  Heart Rate:  [72-90] 83  Resp:  [16-18] 16  BP: (100-124)/(57-79) 107/61  SpO2:  [96 %-100 %] 98 %    GEN - Well appearing male,  cooperative, no acute distress  HEENT- NCAT, conjunctivae/corneas clear. PERRL, EOM's intact. No icterus. External ears normal, mucosa normal  Neck: -5 x 8 cm mass on the right submandibular area, mild overlying skin changes, increased fluctuance from yesterday, no warmth,   Lungs -  Normal work of breathing  Abdomen - Abdomen soft, non-tender, non distended. BS normal. No masses, organomegaly. No CVA tenderness.  Extremities -  Extremities normal. No deformities, edema, or skin discoloration  Skin - See above  Neuro - alert and oriented  Psych- mood and affect normal

## 2017-12-06 NOTE — Unmapped (Signed)
Problem: Patient Care Overview  Goal: Plan of Care Review  Outcome: Progressing  No acute changes with pt overnight. Will continue to monitor.

## 2017-12-07 LAB — BASIC METABOLIC PANEL
ANION GAP: 10 mmol/L (ref 9–15)
BLOOD UREA NITROGEN: 18 mg/dL (ref 7–21)
BUN / CREAT RATIO: 11
CALCIUM: 9.9 mg/dL (ref 8.5–10.2)
CHLORIDE: 112 mmol/L — ABNORMAL HIGH (ref 98–107)
CO2: 21 mmol/L — ABNORMAL LOW (ref 22.0–30.0)
EGFR MDRD AF AMER: 56 mL/min/{1.73_m2} — ABNORMAL LOW (ref >=60)
EGFR MDRD NON AF AMER: 46 mL/min/{1.73_m2} — ABNORMAL LOW (ref >=60)
GLUCOSE RANDOM: 118 mg/dL (ref 65–179)
POTASSIUM: 3.8 mmol/L (ref 3.5–5.0)
SODIUM: 143 mmol/L (ref 135–145)

## 2017-12-07 LAB — CBC
HEMATOCRIT: 40.5 % — ABNORMAL LOW (ref 41.0–53.0)
HEMOGLOBIN: 13.7 g/dL (ref 13.5–17.5)
MEAN CORPUSCULAR HEMOGLOBIN CONC: 33.8 g/dL (ref 31.0–37.0)
MEAN CORPUSCULAR HEMOGLOBIN: 30.7 pg (ref 26.0–34.0)
MEAN PLATELET VOLUME: 9 fL (ref 7.0–10.0)
PLATELET COUNT: 210 10*9/L (ref 150–440)
RED BLOOD CELL COUNT: 4.46 10*12/L — ABNORMAL LOW (ref 4.50–5.90)
WBC ADJUSTED: 9 10*9/L (ref 4.5–11.0)

## 2017-12-07 LAB — CO2: Carbon dioxide:SCnc:Pt:Ser/Plas:Qn:: 21 — ABNORMAL LOW

## 2017-12-07 LAB — HEMOGLOBIN: Lab: 13.7

## 2017-12-07 MED ORDER — NICOTINE 7 MG/24 HR DAILY TRANSDERMAL PATCH
MEDICATED_PATCH | Freq: Every day | TRANSDERMAL | 0 refills | 0 days | Status: CP
Start: 2017-12-07 — End: 2018-12-21

## 2017-12-07 MED ORDER — AMOXICILLIN 875 MG-POTASSIUM CLAVULANATE 125 MG TABLET
ORAL_TABLET | Freq: Two times a day (BID) | ORAL | 0 refills | 0.00000 days | Status: CP
Start: 2017-12-07 — End: 2017-12-11

## 2017-12-07 MED ORDER — TOPIRAMATE 100 MG TABLET
ORAL_TABLET | Freq: Two times a day (BID) | ORAL | 5 refills | 0.00000 days | Status: CP
Start: 2017-12-07 — End: 2017-12-29

## 2017-12-07 MED ORDER — NICOTINE (POLACRILEX) 2 MG BUCCAL LOZENGE
BUCCAL | 0 refills | 0.00000 days | Status: CP | PRN
Start: 2017-12-07 — End: 2018-01-06

## 2017-12-07 MED ORDER — EMPTY CONTAINER
2 refills | 0 days
Start: 2017-12-07 — End: 2018-12-07

## 2017-12-07 MED ORDER — PROPRANOLOL 80 MG TABLET
ORAL_TABLET | Freq: Two times a day (BID) | ORAL | 0 refills | 0 days | Status: CP
Start: 2017-12-07 — End: 2017-12-14

## 2017-12-07 NOTE — Unmapped (Addendum)
Glenn Kennedy is a 37 y.o. male with migraines and HTN who presented to [facility] with submandibular swelling c/w cellulitis and early fluid collection.  ??  Cellulitis/ early submandibular abscess: Prior to admission, he had I&D on 1/12 with reported blood drainage without purulence. He was discharged from the ED with PO Bactrim but returned with continued pain and swelling in the submandibular area. CT showed submandibular subcutaneous collection consistent with a phlegmon/ early abscess with surrounding cellulitis as well as reactive submandibular adenopathy. Prior to transfer to [facility], he was evaluated by OMFS and ENT who believed that, based on current clinical status and CT imaging, no drainage or surgical intervention was required and there was very little concern for airway compromise. He was treated empirically with IV Unasyn and ultimately transitioned to augmentin for a total 7 day course.   ??  AKI: On admission labs, noted to have creatinine elevated above baseline.  On medication review, was notably on multiple NSAIDs as well as chlorthalidone.  These medications had been stopped by PCP prior to admission but he had continued taking some of them.  He was counseled about avoidance of nephrogenic agents prior to discharge.   ??  HTN: Home chlorthalidone was discontinued as above. He remained normotensive during admission.

## 2017-12-07 NOTE — Unmapped (Signed)
[facility]   Patient Onboarding/Medication Counseling    Mr.Gaal is a 37 y.o. male with chronic migraines who I am counseling today on initiation of therapy.    Medication: Emgality 120mg /ml    Verified patient's date of birth / HIPAA.      Education Provided: ??    Dose/Administration discussed: Inject 2 pens (240mg ) total under the skin for first dose, then 1 pen (120mg ) every 30 days. This medication should be taken  without regard to food.     Storage requirements: this medicine should be stored in the refrigerator.     Side effects discussed: Discussed common side effects, including, but not limited to, injection site reaction, etc.. If patient experiences signs/sympoms of an allergic reaction (hives/rash/itching, red/swollen/blistered/peeling skin, wheezing, tightness in chest/throat, difficultly breathing/swallowing/talking, unusual hoarseness, swelling of mouth/face/lips/tongue/throat, etc.) or sob, they need to call the doctor.  Patient will receive a Lexi-Comp drug information handout with shipment.    Handling precautions reviewed:  Patient will dispose of needles in a sharps container or empty laundry detergent bottle.    Drug Interactions: other medications reviewed and up to date in Epic.  No drug interactions identified.    Comorbidities/Allergies: reviewed and up to date in Epic.    Verified therapy is appropriate and should continue      Delivery Information    Anticipated copay of $3.80 reviewed with patient. Verified delivery address in FSI and reviewed medication storage requirement.    Scheduled delivery date: 12/13/17    Explained that we ship using UPS and this shipment will not require a signature.      Explained the services we provide at [facility] and that each month we would call to set up refills.  Stressed importance of returning phone calls so that we could ensure they receive their medications in time each month.  Informed patient that we should be setting up refills 7-10 days prior to when they will run out of medication.  Informed patient that welcome packet will be sent.      Patient verbalized understanding of the above information as well as how to contact the pharmacy at [phone] option [phone] with any questions/concerns.        Patient Specific Needs      ? Patient has no physical or cognitive barriers.    ? Patient prefers to have medications discussed with  Patient     ? Patient is able to read and understand education materials at a high school level or above.        Karene Fry Chin Wachter  [facility] Specialty Pharmacist

## 2017-12-07 NOTE — Unmapped (Signed)
Problem: Patient Care Overview  Goal: Plan of Care Review  Pt mandibular soft improving no pain,will be DC home on oral abx therapy.VSS.

## 2017-12-07 NOTE — Unmapped (Signed)
Physician Discharge Summary    Identifying Information:   Glenn Kennedy  1981/04/21  [id number]    Admit date: 12/04/2017    Discharge date: 12/07/2017     Discharge Service: General Medicine (MED)    Discharge Attending Physician: Anell Barr, MD    Discharge to: Home    Discharge Diagnoses:  Principal Problem:    Submandibular swelling  Active Problems:    Chronic migraine without aura without status migrainosus, not intractable    Hypertension    Neck pain, chronic    AKI (acute kidney injury) (CMS-HCC)  Resolved Problems:    * No resolved hospital problems. *      Post Discharge Follow Up Issues:   - BMP for evaluation of renal function in the setting of AKI as below  - Avoid NSAIDs and other nephrotoxic agents  - Completing total 7 day Abx course   - Chlorthalidone stopped, may need addition of other antihypertensive agent     Hospital Course:   Glenn Kennedy is a 37 y.o. male with migraines and HTN who presented to [facility] with submandibular swelling c/w cellulitis and early fluid collection.  ??  Cellulitis/ early submandibular abscess: Prior to admission, he had I&D on 1/12 with reported blood drainage without purulence. He was discharged from the ED with PO Bactrim but returned with continued pain and swelling in the submandibular area. CT showed submandibular subcutaneous collection consistent with a phlegmon/ early abscess with surrounding cellulitis as well as reactive submandibular adenopathy. Prior to transfer to [facility], he was evaluated by OMFS and ENT who believed that, based on current clinical status and CT imaging, no drainage or surgical intervention was required and there was very little concern for airway compromise. He was treated empirically with IV Unasyn and ultimately transitioned to augmentin for a total 7 day course.   ??  AKI: On admission labs, noted to have creatinine elevated above baseline.  On medication review, was notably on multiple NSAIDs as well as chlorthalidone.  These medications had been stopped by PCP prior to admission but he had continued taking some of them.  He was counseled about avoidance of nephrogenic agents prior to discharge.   ??  HTN: Home chlorthalidone was discontinued as above. He remained normotensive during admission.       Procedures:  None  No admission procedures for hospital encounter.  ______________________________________________________________________    Discharge Day Services:  BP 119/73  - Pulse 77  - Temp 36.7 ??C (Tympanic)  - Resp 16  - Ht 170.2 cm (5' 7)  - Wt 92.2 kg (203 lb 3.2 oz)  - SpO2 99%  - BMI 31.83 kg/m??   Pt seen on the day of discharge and determined appropriate for discharge.    Condition at Discharge: good      ______________________________________________________________________  Discharge Medications:     Your Medication List      STOP taking these medications    chlorthalidone 25 MG tablet  Commonly known as:  HYGROTON     ketorolac 10 mg tablet  Commonly known as:  TORADOL     methylPREDNISolone 4 mg tablet  Commonly known as:  MEDROL DOSEPACK     sulfamethoxazole-trimethoprim 800-160 mg per tablet  Commonly known as:  BACTRIM DS     tiZANidine 2 MG tablet  Commonly known as:  ZANAFLEX        START taking these medications    amoxicillin-clavulanate 875-125 mg per tablet  Commonly known as:  AUGMENTIN  Take 1 tablet by mouth Two (2) times a day. for 7 doses To start 1/17 PM     galcanezumab-gnlm injection  Commonly known as:  EMGALITY  Inject 120 mg under the skin every thirty (30) days.     nicotine 7 mg/24 hr patch  Commonly known as:  NICODERM CQ  Place 1 patch on the skin daily.     nicotine polacrilex 2 MG lozenge  Commonly known as:  NICORETTE  Apply 1 lozenge (2 mg total) to cheek every hour as needed for smoking cessation.        CONTINUE taking these medications    cyclobenzaprine 10 MG tablet  Commonly known as:  FLEXERIL     diphenhydrAMINE 25 mg capsule  Commonly known as:  BENADRYL  Take 1 capsule (25 mg total) by mouth every four (4) hours as needed for itching (headache).     promethazine 25 MG tablet  Commonly known as:  PHENERGAN  Take 1 tablet (25 mg total) by mouth every six (6) hours as needed for nausea (and headache).     propranolol 80 MG tablet  Commonly known as:  INDERAL  Take 1 tablet (80 mg total) by mouth Two (2) times a day.     rizatriptan 10 MG disintegrating tablet  Commonly known as:  MAXALT-MLT  Take 1 tablet (10 mg total) by mouth once as needed for migraine. May repeat in 2 hours if needed     topiramate 100 MG tablet  Commonly known as:  TOPAMAX  Take 1 tablet (100 mg total) by mouth Two (2) times a day.          ______________________________________________________________________  Pending Test Results (if blank, then none):   Order Current Status    Blood Culture In process    Blood Culture, Adult Preliminary result          Most Recent Labs:  Microbiology Results (last day)     Procedure Component Value Date/Time Date/Time    Blood Culture, Adult [[id number]]  (Normal) Collected:  12/04/17 2050    Lab Status:  Preliminary result Specimen:  Blood from Peripheral Updated:  12/06/17 2115     Blood Culture, Routine No Growth at 48 hours          Lab Results   Component Value Date    WBC 9.0 12/07/2017    HGB 13.7 12/07/2017    HCT 40.5 (L) 12/07/2017    PLT 210 12/07/2017       Lab Results   Component Value Date    NA 143 12/07/2017    K 3.8 12/07/2017    CL 112 (H) 12/07/2017    CO2 21.0 (L) 12/07/2017    BUN 18 12/07/2017    CREATININE 1.70 (H) 12/07/2017    CALCIUM 9.9 12/07/2017       Lab Results   Component Value Date    ALKPHOS 144 (H) 12/05/2017    BILITOT 0.3 12/05/2017    PROT 7.0 12/05/2017    ALBUMIN 4.1 12/05/2017    ALT 40 12/05/2017    AST 30 12/05/2017       No results found for: PT, INR, APTT  Hospital Radiology:  Ct Neck Soft Tissue W Contrast    Result Date: 12/04/2017  EXAM: Computed tomography, soft tissue neck with contrast material. DATE: 12/04/2017 4:08 PM ACCESSION: [id number] UN DICTATED: 12/04/2017 4:11 PM INTERPRETATION LOCATION: Main [facility] CLINICAL INDICATION: 37 years old Male with OTHER-abscess righ side of mandible-  COMPARISON:  Concurrent CT head. TECHNIQUE: Axial CT images of the neck from the skull base through the thoracic inlet after the administration of intravenous contrast. Coronal and sagittal reformatted images, bone and soft tissue algorithm are provided. FINDINGS: The visualized portions of the brain and the posterior fossa are normal. Right jaw rim-enhancing hypoattenuating collection measuring 4.4 x 1.8 x 1.4 cm (AP x transverse x CC) . There is a tiny single locule of gas within the center of the collection. No extension into or through the underlying fascia. The paranasal sinuses, orbits, nasopharynx, oropharynx, and oral cavity are normal.  The salivary glands, including the parotid glands and submandibular glands, are normal. The larynx, hypopharynx, and thyroid gland are normal. Enlarged bilateral submandibular lymph nodes measuring up to 1.3 cm in short axis. Rounded bilateral level 2 lymph nodes, likely reactive. No bone abnormality is demonstrated.  The lung apices are normal.  Normal intravascular enhancement is seen.      -Right submandibular subcutaneous collection consistent with a phlegmon/ early abscess with surrounding cellulitis. Reactive submandibular adenopathy. No involvement of the underlying bone. -Locule of gas within the collection, likely iatrogenic. If no injection/intervention has been performed, this would be concerning for gas-forming organism.      ______________________________________________________________________    Discharge Instructions:   Activity Instructions     Activity as tolerated                     Follow Up instructions and Outpatient Referrals     Call MD for:  difficulty breathing, headache or visual disturbances       Call MD for:  persistent dizziness or light-headedness       Call MD for: persistent nausea or vomiting       Call MD for:  temperature >38.5 Celsius       Discharge instructions       You have been admitted to [facility] for evaluation and treatment of the infection on your chin. You were treated with antibiotics in the hospital but will need to continue taking these at home for a few days.       **DISCHARGE INSTRUCTIONS**  1) Please, take your medications as prescribed and note the following changes:   - START TAKING augmentin, an antibiotic. You will take a dose tonight when you get home, and then take it in the morning and before bed on Friday, Saturday and Sunday. You should run out of pills after that.   - STOP TAKING chlorthalidone   - STOP TAKING toradol   - Take all other medications as prescribed     2) Please, make all follow up appointments and be sure to take all your medications with you so your provider can better guide your care.   - You will see Dr. Hulan Fess on 1/23    3) If following discharge from the hospital notice the development or worsening of any symptoms such as nausea, vomiting, chest pain, shortness of breath, fevers, or chills, please return to the emergency department. If you develop these symptoms, or if you have trouble obtaining any of your medications, you can contact the Internal Medicine Same Day Clinic at [phone], or you can go to the [facility] at [facility] in [place]. You can also call the [facility] Link at [phone].     Please continue to follow-up with your outpatient care providers. Some of your follow-up appointments have been listed above. Please be sure to attend  these appointments at the dates and times indicated.     Your discharge medications are listed above. Please continue to take these medications as directed by the pharmacist. Note the following changes from your previous medication list.               Appointments which have been scheduled for you    Dec 13, 2017 10:50 AM EST  (Arrive by 10:35 AM) RETURN  ATTENDING with Roma Schanz, MD  [facility] INTERNAL MEDICINE [facility]) [address]  [place] [address]  [phone]   Dec 29, 2017  1:00 PM EST  (Arrive by 12:45 PM)  BOTOX with Enedina Finner, MD  [facility] INTERNAL MEDICINE [facility]) [address]  [place] [address]  [phone]          Length of Discharge: I spent greater than 30 mins in the discharge of this patient.

## 2017-12-07 NOTE — Unmapped (Signed)
PA APPROVED FOR EMGALITY FROM 11-19-17 TO 11-20-18 FOR $3.80

## 2017-12-07 NOTE — Unmapped (Signed)
Problem: Patient Care Overview  Goal: Plan of Care Review  Outcome: Progressing  No acute changes in patient status overnight. PRN Tylenol given for headache. Tolerated mediations, solids, fluids w/o issue.Will continue to monitor.

## 2017-12-12 MED FILL — SHARPS KIT/NA/MISC: SHARPS KIT/NA/MISC | 120 days supply | Qty: 1 | Fill #0

## 2017-12-12 MED FILL — EMGALITY/120MG/ML/SOAJ: EMGALITY/120MG/ML/SOAJ | 30 days supply | Qty: 2 | Fill #0

## 2017-12-12 NOTE — Unmapped (Signed)
[place] Assessment of Medications Program (CAMP)                                  RECRUITMENT SUMMARY NOTE     ?? Called patient today to introduce CAMP Clinic as part of [facility]  ?? Referral:Yes TOC  ?? Reason for Referral:Transitions of Care Med Rec  ?? Call attempt:1st attempt  ?? Outcome of call: no answer, voicemail left to return call  ?? Discussed the following: Program Services  ?? Program status: Outreach  ??    Evelynn Hench A. Adisyn Ruscitti   Clinical Operations Specialist/CPHT  [place] Assessment of Medication Program (CAMP)

## 2017-12-13 ENCOUNTER — Ambulatory Visit: Admit: 2017-12-13 | Discharge: 2017-12-13 | Payer: MEDICARE

## 2017-12-13 DIAGNOSIS — N179 Acute kidney failure, unspecified: Secondary | ICD-10-CM

## 2017-12-13 DIAGNOSIS — K122 Cellulitis and abscess of mouth: Secondary | ICD-10-CM

## 2017-12-13 DIAGNOSIS — G43709 Chronic migraine without aura, not intractable, without status migrainosus: Secondary | ICD-10-CM

## 2017-12-13 DIAGNOSIS — I10 Essential (primary) hypertension: Secondary | ICD-10-CM

## 2017-12-13 DIAGNOSIS — Z09 Encounter for follow-up examination after completed treatment for conditions other than malignant neoplasm: Principal | ICD-10-CM

## 2017-12-13 LAB — POTASSIUM: Potassium:SCnc:Pt:Ser/Plas:Qn:: 3.8

## 2017-12-13 LAB — SODIUM: Sodium:SCnc:Pt:Ser/Plas:Qn:: 142

## 2017-12-13 LAB — CREATININE
CREATININE: 1.97 mg/dL — ABNORMAL HIGH (ref 0.70–1.30)
Creatinine:MCnc:Pt:Ser/Plas:Qn:: 1.97 — ABNORMAL HIGH

## 2017-12-13 MED ORDER — AMLODIPINE 5 MG TABLET
ORAL_TABLET | Freq: Every day | ORAL | 3 refills | 0.00000 days | Status: CP
Start: 2017-12-13 — End: 2018-06-15

## 2017-12-13 MED ORDER — AMLODIPINE 5 MG TABLET: 5 mg | tablet | 3 refills | 0 days

## 2017-12-13 NOTE — Unmapped (Signed)
[facility] Medicine [facility]  Established Visit - Extensivist    Reason for Visit:  Follow-up for follow-up after ER visit for headache, hypertension     Assessment/Plans:  1. Chronic migraine without aura without status migrainosus, not intractable    2. Hospital discharge follow-up    3. Essential hypertension    4. AKI (acute kidney injury) (CMS-HCC)        Glenn Kennedy is a 37 y.o. male with PMHx of chronic recurrent migraine, hypertension who presents for follow-up for headache, hypertension.     Diagnoses and all orders for this visit:    Hospital discharge follow-up for abscess of submandibular region, right  Appears to be healing well without any signs of infection. Completed Augmentin today.  Gave return precautions and instructed to come to Same Day Clinic, [facility], or Urgent care if any symptoms were to re-occur  - Ambulatory referral to Internal Medicine    Chronic migraine without aura without status migrainosus, not intractable  -- Administered Emgality x2 in clinic today and went over technique with patient and his mother. Instructed patient to inject 125 mg monthly. Other injected once, I injected the other  -- Continue with Topamax and Rizatriptan    Essential hypertension  -- Will start amlodipine 5 mg daily.   - amLODIPine (NORVASC) 5 MG tablet; Take 1 tablet (5 mg total) by mouth daily.  Dispense: 90 tablet; Refill: 3    AKI (acute kidney injury) (CMS-HCC)  - Sodium; Future  - Potassium Level; Future  - Creatinine; Future  -- Cr elevated compared to admission. Patient adamantly denies using any NSAIDs for headache.    -- instructed to stay hydrated  -- placed orders for repeat BMP at [facility] on Monday 1/28.        Follow up: Return in about 1 month (around 01/13/2018).      I spent a total of 40 minutes (16109) minutes face-to-face with the patient, of which greater than 50% was spent counseling the patient regarding teaching mother and patient how to inject Emgality at home, appropriate use of urgent care vs. ED, return precautions related to submandibular abscess.      __________________________________________________________    HPI:  Glenn Kennedy is a 37 y.o. year old male who has Chronic migraine without aura without status migrainosus, not intractable; Hypertension; Neck pain, chronic; AKI (acute kidney injury) (CMS-HCC); and Submandibular swelling on his problem list. and presents to clinic today for hospital follow up.     Presents to clinic today with his mother and son. Reports that he is doing well. His neck pain has improved since last visit. Did have small amount of puss near the area, but no blood, and he tried to express the pus himself. Abscess was present for 3-4 days prior to his initial presentation to the ED.      Is accompanied with Emgality injections today. He would like more information concerning administering the medication and would like his mother to learn as well so she can help with injections in the future.     For his migraines he continues with Topamax and Rizatriptan. Is not using ibuprofen for migraines.  Currently is headache free.      ROS:  All other systems reviewed are negative.    Medications:  Reviewed in EPIC    Physical Exam:   Vital Signs:  BP 137/78  - Pulse 104  - Ht 170.2 cm (5' 7)  - Wt 94.8 kg (  209 lb)  - BMI 32.73 kg/m??   BP Readings from Last 3 Encounters:   12/13/17 137/78   12/07/17 119/73   12/02/17 129/70      Wt Readings from Last 3 Encounters:   12/13/17 94.8 kg (209 lb)   12/05/17 92.2 kg (203 lb 3.2 oz)   10/27/17 90.4 kg (199 lb 4.7 oz)     Body mass index is 32.73 kg/m??.    Gen: Well appearing, NAD  ENT:  1.5 cm nodular right submandibular lesion without fluctuance or tenderness  CV:  RRR, no murmurs      I, Josh Tanner, scribed this note in the presence of Kurtis Bushman, MD    The above service was scribed by Lilyan Punt on my behalf and I attest to the accuracy of the documentation.    Kurtis Bushman, MD

## 2017-12-13 NOTE — Unmapped (Addendum)
[facility] [employer].  [phone]  ?? Call for refills 7-10 days before you run out of medication. Make sure to call around February 14th for your next refill. Informed patient that welcome packet will be sent.    ?? New medication for blood pressure - Amlodipine - sent to [facility] to mail to you  ?? Avoid taking ibuprofen.

## 2017-12-13 NOTE — Unmapped (Signed)
[place] Assessment of Medications Program (CAMP)                                  RECRUITMENT SUMMARY NOTE     ?? Called patient today to introduce CAMP Clinic as part of [facility] ACO  ?? Referral:Yes toc  ?? Reason for Referral:Transitions of Care Med Rec  ?? Call attempt:2nd attempt  ?? Outcome of call: call completed with patient  ?? Discussed the following: Program Services  ?? Program status: Declined  ??    Quency Tober A. Tameem Pullara   Clinical Operations Specialist/CPHT  [place] Assessment of Medication Program (CAMP)

## 2017-12-14 MED ORDER — PROPRANOLOL 80 MG TABLET
ORAL_TABLET | Freq: Two times a day (BID) | ORAL | 11 refills | 0.00000 days | Status: CP
Start: 2017-12-14 — End: 2017-12-29

## 2017-12-14 MED FILL — AMLODIPINE/5MG/TABS: AMLODIPINE/5MG/TABS | 90 days supply | Qty: 90 | Fill #0

## 2017-12-14 NOTE — Unmapped (Signed)
[facility] Extensivist Program Case Management  COLLATERAL CONTACT NOTE     [facility] Case Manager spoke with [facility] [place]) (non-patient, provider or family member with release)  via telephone for care coordination.    Collateral Contact Person stated: Patient is able to come to the hospital in [facility] to have labs done during regular lab business hours of 8:30-4:30 as long as the orders are placed in the patient's Epic chart. Confirmed that labs have been placed in chart. Rn CM called patient and advised him that he can go to [facility] to have labs done and gave him the times during the day. Patient verbalized understanding and asked if he could go on Monday. Informed patient that Monday between 8:30 and 4:40 is fine. Encouraged patient to continue to drink plenty of water, and patient verbalized understanding and stated I am.  No further questions or concerns at this time. Will continue to monitor.    Future Appointments  Date Time Provider Department Center   12/29/2017 1:00 PM [id number] [place], MD [facility]   01/09/2018 3:40 PM Roma Schanz, MD [facility]

## 2017-12-14 NOTE — Unmapped (Signed)
[facility] Internal Medicine - Extensivist Program  Telephone Encounter    Called patient to discuss recent lab results, primarily AKI.  Patient again confirmed that he is not taking any NSAIDS (specifically asked about ibuprofen, Advil, Motrin, Aleve, goody powders, BC powders) and he stated no.  Spoke with his mother to review medications he has at home as he did not bring these to his appt yesterday. She was able to confirm that he does not have chlorthalidone at home.  Instructed mother and patient to ensure that he is drinking plenty of fluids until urine is pale yellow in color.  Mother was able to teach back these instructions.      Asked that they come for lab check on Monday 12/18/2017.  Would preferably like to have blood drawn at [facility] as it is closer to house.  Placed orders for BMP. Will have RN find out if can get blood drawn there and will follow-up with patient.     Time spent:  30 mins

## 2017-12-15 MED ORDER — PROMETHAZINE 25 MG TABLET
ORAL_TABLET | 0 refills | 0 days | Status: CP
Start: 2017-12-15 — End: 2017-12-29

## 2017-12-15 MED ORDER — TIZANIDINE 2 MG TABLET
ORAL_TABLET | 0 refills | 0 days | Status: CP
Start: 2017-12-15 — End: 2017-12-29

## 2017-12-19 NOTE — Unmapped (Signed)
[facility] Extensivist Program CASE MANAGEMENT   Brief Note    RN CM left message for patient on cell # as well as patient's mother, Liborio Nixon, on cell # to remind patient he needs to have labs redrawn and can do this at [facility], as discussed with the patient last week. RN CM reinforced the importance of having these done today to check on patient kidney function and provided information that the lab is open today until 4:30pm, lab orders are in the system, and patient can walk in any time during business hours. RN CM requested patient or mother return call with any questions or issues they may have.  Will continue to monitor to ensure labs complete and contact patient again if they have not be done today.    Plan:     1. Case Management to: follow up 1/30 to ensure labs complete and call patient if they have not been done.  2. Patient to: have lab work done at [facility].    Discuss at next outreach: PHQ-2, Chronic condition: migraine management and ADLs/IADLs    Care Coordination Note updated in [facility]: Yes

## 2017-12-20 ENCOUNTER — Ambulatory Visit: Admit: 2017-12-20 | Discharge: 2017-12-21 | Payer: MEDICARE

## 2017-12-20 DIAGNOSIS — N179 Acute kidney failure, unspecified: Principal | ICD-10-CM

## 2017-12-20 LAB — SODIUM: Sodium:SCnc:Pt:Ser/Plas:Qn:: 143

## 2017-12-20 LAB — POTASSIUM: Potassium:SCnc:Pt:Ser/Plas:Qn:: 3.6

## 2017-12-20 LAB — EGFR MDRD NON AF AMER
Glomerular filtration rate/1.73 sq M.predicted.non black:ArVRat:Pt:Ser/Plas/Bld:Qn:Creatinine-based formula (MDRD): 49 — ABNORMAL LOW

## 2017-12-20 LAB — CREATININE: CREATININE: 1.6 mg/dL — ABNORMAL HIGH (ref 0.70–1.30)

## 2017-12-21 NOTE — Unmapped (Signed)
[facility] Extensivist Program CASE MANAGEMENT   FOLLOW UP NOTE  Summary:  Advocate Case Manager spoke with patient's mother, Glenn Kennedy, today for Case Management follow up. Patient currently resides at Home. Primary concern is follow up on kidney function lab results.      RN CM left message for patient on his cell phone with lab results and encouraged patient to continue to drink plenty of water and avoid NSAIDs (Ibuprofen, Tylenol, BC P or Goody's, etc.) and continue to take his other medications as prescribed. Spoke with patient's mother and relayed lab results information (much improved kidney function) and to continue pushing fluids and avoid NSAIDs. Patient's mother verbalized understanding and stated I have been telling him to drink more water. Praised and encouraged patient's mother to continue. Reviewed upcoming appointments, 2/8 with Dr. Jenita Seashore, and 2/19 with Dr. Hulan Fess. Patient's mother verbalized understanding. No further questions or concerns at this time.  Will continue to monitor.      Barriers to care: Transportation  Financial  Medication compliance  Psychiatric/Social   Misses appointments  Health Literacy    Assessments completed today:  Chronic condition: migraine management and ADLs/IADLs    Plan:     1. Case Management to: Follow up in 2 weeks Route note to Care Team  2. Patient to: continue drinking plenty of water, avoid NSAIDs, and continue taking all medications as prescribed.    Discuss at next outreach: Falls Risk, ADLs/IADLs and Advanced Care Planning    Care Coordination Note updated in [facility]: Yes      Future Appointments  Date Time Provider Department Center   12/29/2017 1:00 PM [facility] Francine Graven, MD [facility]   01/09/2018 3:40 PM Roma Schanz, MD [facility]       Objective:   Problem List     ??? Migraines    ??? High blood pressure disorder    ??? Acute nontraumatic kidney injury    ??? Abscess of submandibular region, right          Allergies:   No Known Allergies    Medications:  Prior to Admission medications    Medication Dose, Route, Frequency   amLODIPine (NORVASC) 5 MG tablet 5 mg, Oral, Daily (standard)   cyclobenzaprine (FLEXERIL) 10 MG tablet No dose, route, or frequency recorded.   diphenhydrAMINE (BENADRYL) 25 mg capsule 25 mg, Oral, Every 4 hours PRN   galcanezumab-gnlm (EMGALITY) injection 120 mg, Subcutaneous, Every 30 days   nicotine (NICODERM CQ) 7 mg/24 hr patch 1 patch, Transdermal, Daily (standard)   nicotine polacrilex (NICORETTE MINI) lozenge 2 mg 2 mg, Buccal, Every 1 hour prn   promethazine (PHENERGAN) 25 MG tablet TAKE 1 TABLET(25 MG) BY MOUTH EVERY 6 HOURS AS NEEDED FOR NAUSEA OR HEADACHE   propranolol (INDERAL) 80 MG tablet 80 mg, Oral, 2 times a day (standard)   rizatriptan (MAXALT-MLT) 10 MG disintegrating tablet 10 mg, Oral, Once as needed, May repeat in 2 hours if needed   tiZANidine (ZANAFLEX) 2 MG tablet TAKE 1 TABLET(2 MG) BY MOUTH EVERY 6 HOURS AS NEEDED   topiramate (TOPAMAX) 100 MG tablet 100 mg, Oral, 2 times a day (standard)          Junita Push, RN-BC  12/21/2017

## 2017-12-22 NOTE — Unmapped (Signed)
BOTOX ADMINISTRATION FOR CHRONIC MIGRAINE VISIT NOTE    INTERNAL MEDICINE CLINIC  ACC    Glenn Kennedy  12/29/17      Reason for visit:          The patient has migraine headaches refractory to previous forms of therapy including pharmacologic agents. Has been maintained on botulinum toxin with good results and returns for Botox injection for chronic migraine prophylaxis.     Diagnosis: Chronic Migraine Headaches    Date of Last Toxin Injection: September 29, 2017-First Rx.     Subjective Relief of Symptoms:N/A First Rx.     Duration of Benefit: N/A weeks    Complications After Last Injection: N/A    Duration of Complications: N/A weeks    ROS: Started Emgality since last visit.     Exam:     Vital Signs:   There were no vitals filed for this visit.    Mental Status: Alert and oriented. Understands procedure and previously provided consent.   Cranial nerves: no obvious ptosis. Full ROM at the trapezii bilaterally.  Motor: No cervical paracervical weakness.   Gait: normal.         Assessment:     Failed medical therapy with numerous agents by mouth, with headaches more than 15 days per month, each lasting up to and often more than 4 hours per day.       Plan:     After discussing risks, benefits, complications and alternatives with the patient and after reviewing the patient disclosure, we proceeded with injecting 155 units of Botulinum toxin type A  in the standard chronic migraine headache pattern.    The risks, benefits and alternatives of the Botox injection were discussed with the patient. The risks include but are not limited to pain, blindness, bruising, swelling and the need for further procedures. The patient had all questions answered, voiced understanding and wished to proceed with the treatment. Written consent was obtained.     During the discussion the patient was informed that this consent is valid for up to one year provided there was no change in their condition being treated, their proposed treatment and the involved risks.  The patient was advised that this consent may be revoked at any time. The patient voiced understanding.       Chronic Migraine Headaches: Now with recurrence of symptoms    PROCEDURE NOTE     ONABOTULINUM TOXIN A INJECTIONS:  After examining the patient, proper sites for injection were determined based on standardized mapped out locations including corrugators, procerus, frontalis, temporalis, occipitalis, cervical paraspinals, and trapezius bilaterally known as trigger point injection sites for chronic migraine.    A timeout was performed immediately prior to the procedure, identifying patient, and location of injections.     Sites of injection were prepped in the usual sterile fashion with alcohol swabs.     Injection Pattern (on each side in Units)    Corrugator:  2 sites of 5 units each (10 U)  Procerus:  1 site of 5 units (5U)  Frontalis:  4 sites of 5 units each (20 U)  Temporalis:  4 sites on each side 5 units each; total of 8 sites (40U)  Occipitalis:  3 sites on each side of 5 units each: total of 6 sites (30U)  Cervical paraspinal muscles:  2 sites on each side of 5 units each: total 4 sites. (20U)  Trapezius:  3 sites on each side of 5 units each: total 6 sites (  30U)    There were no immediate complications from the procedure.    A total of 155 units of onabotulinum toxin were injected. 45 units were wasted. See MAR For details of injection/medication/Lot #    Total Units Billed 200    Return Visit Return in about 3 months (around 03/30/2018) for Botox Injections every 12 weeks. for repeat injection.

## 2017-12-29 ENCOUNTER — Ambulatory Visit: Admit: 2017-12-29 | Discharge: 2017-12-30 | Payer: MEDICARE | Attending: Neurology | Primary: Neurology

## 2017-12-29 DIAGNOSIS — I10 Essential (primary) hypertension: Secondary | ICD-10-CM

## 2017-12-29 DIAGNOSIS — G43709 Chronic migraine without aura, not intractable, without status migrainosus: Principal | ICD-10-CM

## 2017-12-29 DIAGNOSIS — G8929 Other chronic pain: Secondary | ICD-10-CM

## 2017-12-29 DIAGNOSIS — M542 Cervicalgia: Secondary | ICD-10-CM

## 2017-12-29 MED ORDER — TIZANIDINE 2 MG TABLET
ORAL_TABLET | Freq: Three times a day (TID) | ORAL | 3 refills | 0 days | Status: CP | PRN
Start: 2017-12-29 — End: 2018-12-21

## 2017-12-29 MED ORDER — PROMETHAZINE 25 MG TABLET
ORAL_TABLET | Freq: Three times a day (TID) | ORAL | 3 refills | 0 days | Status: CP | PRN
Start: 2017-12-29 — End: 2018-06-15

## 2017-12-29 MED ORDER — RIZATRIPTAN 10 MG DISINTEGRATING TABLET
ORAL_TABLET | Freq: Once | ORAL | 3 refills | 0 days | Status: CP | PRN
Start: 2017-12-29 — End: 2018-06-15

## 2017-12-29 MED ORDER — PROPRANOLOL 80 MG TABLET
ORAL_TABLET | Freq: Two times a day (BID) | ORAL | 11 refills | 0.00000 days | Status: CP
Start: 2017-12-29 — End: 2018-06-15

## 2017-12-29 NOTE — Unmapped (Addendum)
I have refilled:   TIZANIDINE- for neck pain and headache- only as needed.   MAXALT (RIZATRIPTAN) - as needed for migraine.   PHENERGAN- as needed for nausea/headache.     Botox Patient Instructions:    Today you received onabotulinum toxin therapy for prevention of your chronic migraine. The following is an outline of potential expectations:     -Temporary soreness over the site of injection is normal. There should not be severe pain, however. If you such pain, or develop moderate to severe swelling, contact my office immediately or make your way to the nearest emergency room for evaluation.     -You may begin to see improvement in your condition between one day to one week after injection. The maximum benefit will be seen starting at about 1 month after your injections. Effect lasts from 3-4 months typically.     - Do not rub the areas, massage the areas or otherwise rub any of the injection sites for 1-2 days. That may spread the medication to areas that may cause symptoms listed below.     -If you notice shortness of breath, difficulty swallowing, or weakness in a muscle other than those that were injected, be sure to contact me to determine the severity of this side effect. It will subside over the course of 3 months, however if the weakness is deemed severe enough we may need to consider more urgent evaluation and interventions while waiting for the medication to wear off.     -Migraine headache: keep a headache calendar in order to determine the number of headache-free days that you experience while on Botox.         -In order to avoid formation of antibodies to toxin, injections should be at least 90 days apart. As such, take this into account when scheduling your future injection visit.     Thank you very much for allowing me to take part in your medical care.    Warmest Regards,    Dr. Hollie Beach  Board Certified: Adult Neurology  Assistant Professor  [facility] Department of Internal Medicine: Division of General Medicine and Clinical Epidemiology  [id number]  [place], [place]    Appointments/Internal Medicine Clinic: [phone]  Administrative Office: [phone]  Fax #: [phone]      Other Helpful Numbers:    [facility] Internal Medicine Same Day Clinic: Call [phone] to schedule an appointment for the same day or to speak to a Triage Nurse  Open: M-W-F 8-5 PM Thursday 9-5 PM    [facility] Program: [phone]  Suicide 24 hr Hotline: [phone]  [facility] (Advice Nurse after hours): [phone]  [facility] Main Line: [phone]  [facility] Urgent Care: [phone] OPEN DAILY FROM 9 AM-8 PM    Thank you for choosing [facility].    Your medical records are available online by signing up for [facility].   You may request any part of your medical record through the Internal Medicine Team members by signing a  Release of Medical Information form, or directly from Medical Information Management at [facility], which can be contacted at:    [email or web address]  Telephone: [phone]   Medical Records: [email or web address]

## 2018-01-02 NOTE — Unmapped (Signed)
[facility]  EXTENSIVIST PROGRAM CASE MANAGEMENT   MAINTENANCE FOLLOW UP NOTE  Summary:  RN CM spoke with patient today for Extensivist Program Case Management follow up. Patient currently resides at Home. Primary concern is receiving refills for emgality.      Patient/caregiver reported he contacted [facility] and I was told I don't have any refills.  Patient provided phone number to pharmacy. Patient reports feeling pretty good; denies current headache, pain, or any health concerns.  RN CM contacted [facility]  And was advised patient does have refills left and can have the medication shipped out today to be received at home tomorrow. Confirmed patient address with [facility] and coordinated refill. RN CM contacted patient again and advised him that medication was being refilled and shipped out today and receipt is expected for tomorrow. Patient verbalized understanding. Assessed patient's ability to administer medication, and patient provided technique  For administration via teach back.  RN CM confirmed appointment with PCP 2/19 with patient. Patient verbalized understanding. No further questions or concerns at this time. Will continue to monitor.    Current Barriers to care: Health literacy and educational/cognitive dysfunction and utilization of acute care facilities for migraine management, however, improving primary care utilization and has been in clinic for botox treatment with Dr. Rulon Eisenmenger to manage migraines.    Assessments completed today:  Chronic condition: migraines and ADLs/IADLs    Have you had any change/worsening of your symptoms? no  Any recent visits to outside providers? (Urgent Care, ER, Hospital)? no  Need any refills?  yes  Any change in allergy status? no  Need any medical equipment?  no  Name of Medical Equipment Supplier: N/A  Does the patient have a follow-up appointment with PCP? Yes, 01/09/18 at 3:40pm.  Does patient have transportation to upcoming appointments? yes  Does the patient have any other questions about his or her health? no  Does the patient need any referrals? no     Education provided: Medication administration.    Progress toward goals: Improve utilization of primary care and specialty care and decrease utilization of acute care for medically stable health problems. Patient is improving primary care utilization by attending appointments as scheduled in Internal Medicine as well as contacting RN CM for health needs, in particular patient has reached out twice for medication and care coordination needs as well as followed up with RN CM reminder to have labs redrawn in the community.    Plan:   1. RN CM to follow up with patient  At next clinic appointment or sooner to ensure potential care coordination needs are addressed.    Care Coordination Note updated in [facility]: Yes    Discuss at next visit:   Falls Risk, PHQ-2 and ADLs/IADLs     Future Appointments  Date Time Provider Department Center   01/09/2018 3:40 PM Roma Schanz, MD [facility]   03/30/2018 2:30 PM Ana Ulysees Barns, MD [facility]         Objective:   Problem List     ??? Migraines    ??? High blood pressure disorder    ??? Acute nontraumatic kidney injury    ??? Abscess of submandibular region, right          Allergies:   No Known Allergies    Medications:  Prior to Admission medications    Medication Dose, Route, Frequency   amLODIPine (NORVASC) 5 MG tablet 5 mg, Oral,  Daily (standard)   diphenhydrAMINE (BENADRYL) 25 mg capsule 25 mg, Oral, Every 4 hours PRN   galcanezumab-gnlm (EMGALITY) injection 120 mg, Subcutaneous, Every 30 days   ketorolac (TORADOL) 10 mg tablet No dose, route, or frequency recorded.   nicotine (NICODERM CQ) 7 mg/24 hr patch 1 patch, Transdermal, Daily (standard)   nicotine polacrilex (NICORETTE MINI) lozenge 2 mg 2 mg, Buccal, Every 1 hour prn   promethazine (PHENERGAN) 25 MG tablet 25 mg, Oral, Every 8 hours PRN   propranolol (INDERAL) 80 MG tablet 80 mg, Oral, 2 times a day (standard)   rizatriptan (MAXALT-MLT) 10 MG disintegrating tablet 10 mg, Oral, Once as needed, May repeat in 2 hours if needed   tiZANidine (ZANAFLEX) 2 MG tablet 2 mg, Oral, Every 8 hours PRN          Junita Push, RN-BC  01/02/2018

## 2018-01-02 NOTE — Unmapped (Signed)
[facility] Specialty Pharmacy Refill Coordination Note  Specialty Medication(s): Emgality 120mg /ml      [employer] Hoctor, DOB: Jan 14, 1981  Phone: [phone] (home) , Alternate phone contact: N/A  Phone or address changes today?: No  All above HIPAA information was verified with patient's caregiver. (Nurse at office called to confirm refill and patient information.  Shipping Address: [address]  [place] [address]   Insurance changes? No    Completed refill call assessment today to schedule patient's medication shipment from the [facility] [phone]).      Confirmed the medication and dosage are correct and have not changed: Yes, regimen is correct and unchanged.    Confirmed patient started or stopped the following medications in the past month:  No, there are no changes reported at this time.    Are you tolerating your medication?:  Ociel reports tolerating the medication.    ADHERENCE  Did you miss any doses in the past 4 weeks? No missed doses reported.    FINANCIAL/SHIPPING    Delivery Scheduled: Yes, Expected medication delivery date: 01/05/2018     Michio did not have any additional questions at this time.    Delivery address validated in FSI scheduling system: Yes, address listed in FSI is correct.    We will follow up with patient monthly for standard refill processing and delivery.      Thank you,  Gretna Bergin  Anders Grant   [facility] Specialty Pharmacist

## 2018-01-04 MED FILL — EMGALITY/120MG/ML/SOAJ: EMGALITY/120MG/ML/SOAJ | 90 days supply | Qty: 3 | Fill #0

## 2018-01-09 ENCOUNTER — Ambulatory Visit: Admit: 2018-01-09 | Discharge: 2018-01-10 | Payer: MEDICARE

## 2018-01-09 DIAGNOSIS — G43709 Chronic migraine without aura, not intractable, without status migrainosus: Principal | ICD-10-CM

## 2018-01-09 DIAGNOSIS — I10 Essential (primary) hypertension: Secondary | ICD-10-CM

## 2018-01-09 DIAGNOSIS — K122 Cellulitis and abscess of mouth: Secondary | ICD-10-CM

## 2018-01-09 NOTE — Unmapped (Signed)
[facility]  Extensivist Program Visit Note     RN CM spoke with patient today in the clinic as part of [facility] Internal Medicine's Extensivist program.     Primary reason for clinic visit/chief complaint: Follow up for chronic migraine management and BP.  Does patient understand their plan of care? Yes   Does patient agree with plan of care? Yes  What barriers does patient currently have? Tourist information centre manager appointments  Difficult coordination with specialists  Difficulty obtaining medication/procedure/test  Education Provided: Medication Management  Resources discussed: ACO Parking Vouchers  Health Link  Same Day and Urgent Care Options    Plan  Case Manager to follow up with patient 2 weeks or sooner as needed for care coordination.    Discuss at next contact: Migraine management, dietary review.     Care Coordination Note updated in [facility]: Yes

## 2018-01-10 NOTE — Unmapped (Signed)
[facility] Internal Medicine Clinic - [facility]  Established Visit    Reason for Visit:  Follow-up for chronic migraines, HTN    1. Chronic migraine without aura without status migrainosus, not intractable    2. Essential hypertension    3. Abscess of submandibular region, right        Assessment/Plans:  Glenn Kennedy is a 37 y.o. male with PMHx of chronic migraines, HTN, and others listed below who presents for follow-up for migraines and HTN.      Diagnoses and all orders for this visit:    Chronic migraine without aura without status migrainosus, not intractable  - reviewed dosing of Emgality q30 days (once a month) with Glenn Kennedy and his mother  - educated that pharmacy sends him 3 month supply at one time  - reviewed proper storing of medication  - reviewed administration of injection with demonstration and teach-back methods by both mother and Glenn Kennedy  - also shared injection administration video with patient  - instructed to return to clinic if any uncertainty or questions regarding in administration of injection  - reviewed Headache Action Plan  - recommended he come to clinic for any recurrent headache.    Essential hypertension  Now at goal on current regimen.   - amlodipine 5 mg daily    Abscess of submandibular region, right, resolved:  Required hospitalization for IV antibiotics.  Has now completely resolved.        I spent a total of 40 minutes (16109) minutes face-to-face with the patient, of which greater than 50% was spent counseling the patient regarding dosing frequency and teaching of proper administration of Emgality, review personalized Headache action plan, appropriate use of ED vs. Urgent care outpatient visits.        Follow up: Return in about 2 months (around 03/09/2018).  __________________________________________________________    HPI:  Glenn Kennedy is a 37 y.o. year old male with PMHx of chronic migraines c/b multiple ED visits, HTN, and others listed below who presents to clinic today for f/u.    Bring 3 boxes of Emgality that he received in the mail from [employer]. They are unsure of when he is suppose to take next dose and if suppose to be all 3 injections.  Presents to clinic with mother who remembers how to administer but would like a refresher. Glenn Kennedy states that he has no migraines or headaches and feels well. Recently seen by Dr. Rulon Eisenmenger and had Botox injections administered.  Still has medications prescribed to him as headache action plan all together in his room.     No issues with taking amlodipine. Denies orthostatic symptoms. No LE swelling.  No chest pain/SOB.      ROS:  Pertinent items are noted in HPI.    Patient Active Problem List   Diagnosis   ??? Chronic migraine without aura without status migrainosus, not intractable   ??? Hypertension   ??? Neck pain, chronic   ??? Abscess of submandibular region, right       Medications:  Reviewed in EPIC      Physical Exam:   Vital Signs:   BP 113/72 (BP Site: L Arm, BP Position: Sitting, BP Cuff Size: Large)  - Pulse 80  - Temp 36.8 ??C (98.3 ??F) (Oral)  - Ht 170.2 cm (5' 7)  - Wt 93.4 kg (206 lb)  - SpO2 100%  - BMI 32.26 kg/m??   Gen: Well appearing, NAD  CV: RRR, no murmurs  Pulm: CTA  bilaterally, no crackles or wheezes  MSK: No edema  SKIN:  Prior submandibular abscess has completely healed.

## 2018-01-10 NOTE — Unmapped (Addendum)
Injection once a month on the 23rd of each month.     Video of how to inject to remind you:  Www.emgality.com -->For Healthcare providers --> Dosing and Administration --> Injection video    If any issues at all with the injection, call Shanda Bumps [phone] or the clinic [phone]    HEADACHE ACTION PLAN:  ??  When you have a headache:  1. Take one dose of each as soon as headache begins:  ? Toradol (for headache pain)  ? Benadryl  ? Phenergan (for nausea)  2. Call Vivia Ewing [phone] or the Internal Medicine Clinic [phone] and ask for Dr. Hulan Fess or Shanda Bumps.    3. Schedule same day appointment at Internal Medicine Clinic  4. IF AFTER HOURS:  CALL [facility] OR GO TO URGENT CARE (INFO BELOW)

## 2018-01-24 NOTE — Unmapped (Addendum)
3/15update: 90ds of this med was filled in Feb, so RTS. LVM for patient to inform him of this and make sure he still has 2 month supply left. Adjusting call and clinical date out.        [facility] Special[facility] Pharmacy Refill and Clinical Coordination Note  Medication(s): EMGALITY    Glenn Kennedy, DOB: 08-Apr-1981  Phone: [phone] (home) , Alternate phone contact: N/A  Shipping address: [address]  [place] [address]  Phone or address changes today?: No  All above HIPAA information verified.  Insurance changes? No    Completed refill and clinical call assessment today to schedule patient's medication shipment from the [facility] [phone]).      MEDICATION RECONCILIATION    Confirmed the medication and dosage are correct and have not changed: Yes, regimen is correct and unchanged.    Were there any changes to your medication(s) in the past month:  No, there are no changes reported at this time.    ADHERENCE    Is this medicine transplant or covered by Medicare Part B? No.    Did you miss any doses in the past 4 weeks? No missed doses reported.  Adherence counseling provided? Not needed     SIDE EFFECT MANAGEMENT    Are you tolerating your medication?:  [name] reports tolerating the medication.  Side effect management discussed: None      Therapy is appropriate and should be continued.    Evidence of clinical benefit: See Epic note from 01/09/18      FINANCIAL/SHIPPING    Delivery Scheduled: Yes, Expected medication delivery date: 02/02/18   Additional medications refilled: No additional medications/refills needed at this time.    The patient will receive an FSI print out for each medication shipped and additional FDA Medication Guides as required.  Patient education from [place] or Robet Leu may also be included in the shipment.    [name] did not have any additional questions at this time.    Delivery address validated in FSI scheduling system: Yes, address listed above is correct.      We will follow up with patient monthly for standard refill processing and delivery.      Thank you,  Thad Ranger   [facility] Specialty Pharmacist

## 2018-02-06 ENCOUNTER — Ambulatory Visit
Admit: 2018-02-06 | Discharge: 2018-02-06 | Disposition: A | Discharge status: Home / Self Care | Payer: MEDICARE | Attending: Emergency Medicine

## 2018-02-06 MED ORDER — SULFAMETHOXAZOLE 800 MG-TRIMETHOPRIM 160 MG TABLET
ORAL_TABLET | Freq: Two times a day (BID) | ORAL | 0 refills | 0.00000 days | Status: CP
Start: 2018-02-06 — End: 2018-02-16

## 2018-02-06 NOTE — Unmapped (Signed)
[facility]  Emergency Department Provider Note    ED Clinical Impression     Final diagnoses:   Cellulitis of forearm, left (Primary)       Initial Impression, ED Course, Assessment and Plan     This is a 37 y.o. male with PMH of HTN, migraines, chronic neck pain who presents with left wrist swelling and lesion that he noticed this morning upon waking.    Vital signs are stable and within normal limits. On examination, patient is nontoxic appearing with 0.5 cm well circumscribed ulcer to the left dorsal aspect of distal wrist. Underlying induration. No purulent drainage at this time. Associated edema and mild warmth to the touch. Total area of induration is about 3x3 cm. FROM of left wrist. Able to form hand grip. Neurovascularly intact distally. 2+ radial pulse. No axillary adenopathy. Forearm compartments are soft and compressible.      Bedside ultrasound was performed and showed no evidence of drainable fluid collection.  Suspect likely cellulitis with small underlying abscess that is already starting to drain. There is no evidence of compartment syndrome.  There is no extension into the hand or concern for flexor tenosynovitis.     Will treat with 10-day course of Bactrim of the patient follow-up with his primary care doctor.  Careful return precautions provided to the patient and included returning to the ED if he develops fevers, worsening of the swelling and redness despite taking antibiotics, difficulties moving his wrist or hand, or any other symptoms that concern him.    Additional Medical Decision Making     Any labs and radiology results that were available during my care of the patient were independently reviewed by me and considered in my medical decision making.  ??  I staffed the case with the ED attending, Dr. Clinton Sawyer, who is agreeable with the above assessment and plan.    Portions of this record have been created using Scientist, clinical (histocompatibility and immunogenetics). Dictation errors have been sought, but may not have been identified and corrected.      ____________________________________________       History     Chief Complaint  Skin Lesions      HPI   Demareon Ulice Follett is a 37 y.o. male with PMH of HTN, migraines, chronic neck pain who presents with left wrist swelling and lesion that he noticed this morning upon waking. He states the area is painful, but denies pain with movement. He first noticed the lesion when driving his child to school this morning. He reports pain and swelling with the lesion, but denies injuries, known traumas, or puncture wounds. He denies radiation of pain into his left hand. He denies any other lesions on other extremities. He notes purulent drainage to the lesion this morning. He denies fevers, chills, vomiting, diarrhea. He is right hand dominant. He has not taken anything for the pain. He denies working outside.       Past Medical History:   Diagnosis Date   ??? Hypertension    ??? Migraines    ??? Migraines    ??? Neck pain, chronic        Patient Active Problem List   Diagnosis   ??? Chronic migraine without aura without status migrainosus, not intractable   ??? Hypertension   ??? Neck pain, chronic   ??? Abscess of submandibular region, right       No past surgical history on file.    No current facility-administered medications for this encounter.  Current Outpatient Prescriptions:   ???  amLODIPine (NORVASC) 5 MG tablet, Take 1 tablet (5 mg total) by mouth daily., Disp: 90 tablet, Rfl: 3  ???  diphenhydrAMINE (BENADRYL) 25 mg capsule, Take 1 capsule (25 mg total) by mouth every four (4) hours as needed for itching (headache)., Disp: 30 capsule, Rfl: 0  ???  galcanezumab-gnlm (EMGALITY) injection, Inject 120 mg under the skin every thirty (30) days., Disp: 3 mL, Rfl: 3  ???  ketorolac (TORADOL) 10 mg tablet, , Disp: , Rfl: 0  ???  nicotine (NICODERM CQ) 7 mg/24 hr patch, Place 1 patch on the skin daily., Disp: 28 patch, Rfl: 0  ???  promethazine (PHENERGAN) 25 MG tablet, Take 1 tablet (25 mg total) by mouth every eight (8) hours as needed for nausea (HJeadache)., Disp: 30 tablet, Rfl: 3  ???  propranolol (INDERAL) 80 MG tablet, Take 1 tablet (80 mg total) by mouth Two (2) times a day., Disp: 60 tablet, Rfl: 11  ???  rizatriptan (MAXALT-MLT) 10 MG disintegrating tablet, Take 1 tablet (10 mg total) by mouth once as needed for migraine. May repeat in 2 hours if needed, Disp: 30 tablet, Rfl: 3    Allergies  Patient has no known allergies.    Family History   Problem Relation Age of Onset   ??? Stroke Mother 25        hemorrhagic   ??? Hyperlipidemia Mother    ??? Hypertension Mother    ??? Migraines Mother    ??? No Known Problems Father    ??? No Known Problems Brother    ??? Migraines Son    ??? No Known Problems Maternal Grandmother    ??? No Known Problems Maternal Grandfather        Social History  Social History   Substance Use Topics   ??? Smoking status: Current Every Day Smoker     Packs/day: 0.50     Years: 15.00     Types: Cigarettes     Start date: 06/01/1997   ??? Smokeless tobacco: Never Used   ??? Alcohol use No       Review of Systems  All 10 systems have been reviewed and are otherwise negative unless otherwise mentioned in HPI.    Physical Exam     ED Triage Vitals [02/06/18 0857]   Enc Vitals Group      BP 128/82      Heart Rate 92      SpO2 Pulse       Resp 18      Temp 35.8 ??C (96.4 ??F)      Temp Source Oral      SpO2 100 %     Constitutional: Alert and oriented. Well appearing and in no distress.  Eyes: Conjunctivae are normal.  ENT       Head: Normocephalic and atraumatic.       Nose: No congestion.       Mouth/Throat: Mucous membranes are moist.       Neck: No stridor.  Hematological/Lymphatic/Immunilogical: No cervical lymphadenopathy. No axillary adenopathy.   Cardiovascular: Normal rate, regular rhythm. Normal and symmetric distal pulses are present in all extremities. 2+ radial pulse.   Respiratory: Normal respiratory effort. Breath sounds are normal.  Gastrointestinal: Soft and nontender. There is no CVA tenderness. Musculoskeletal: FROM of left wrist. Able to form hand grip. Normal range of motion in all extremities.  Neurologic: Normal speech and language. No gross focal neurologic deficits are appreciated. Neurovascularly intact distally.  Skin: Skin is warm, dry. 0.5 cm well circumscribed ulcer to the left dorsal aspect of distal wrist. Underlying induration. No purulent drainage at this time. Associated edema and mild warmth to the touch. Area of induration is about 3x3 cm. Forearm compartments are soft and compressible.   Psychiatric: Mood and affect are normal. Speech and behavior are normal.          Documentation assistance was provided by Orville Govern, Scribe, on February 06, 2018 at 9:04 AM for Leda Gauze, MD.    Documentation assistance was provided by the scribe in my presence.  The documentation recorded by the scribe has been reviewed by me and accurately reflects the services I personally performed.      Leda Gauze, MD  Resident  02/06/18 [phone]

## 2018-02-06 NOTE — Unmapped (Signed)
Discharge completed and all questions answered. Pt given all paperwork and follow up info including phone numbers. Pt denies questions and left dept with steady gait in no acute distress.

## 2018-02-06 NOTE — Unmapped (Signed)
Pt woke this morning with a small fist size lesion on left writs.  Swollen,Red with pin size opening. 5/10 pain.

## 2018-02-09 ENCOUNTER — Ambulatory Visit: Admit: 2018-02-09 | Discharge: 2018-02-09 | Disposition: A | Discharge status: Home / Self Care | Payer: MEDICARE

## 2018-02-09 NOTE — Unmapped (Signed)
[facility]  Emergency Department Provider Note      ED Clinical Impression     Final diagnoses:   Abscess of left forearm (Primary)       Initial Impression, ED Course, Assessment and Plan     Time seen: February 09, 2018 12:55 AM   Glenn Kennedy is a 37 y.o. male with PMH of HTN, migraines who presents to the [facility] Emergency Department for an abscess to his left wrist. On exam, patient appears well, NAD. VSS. 2cm to the back of left wrist over distal ulnar aspect. Swelling extending into hand and forearm. Erythema only over fluctuant area.  Patient has no pain with flexion extension of the wrist and full range of motion of all fingers.  No evidence of extensor involvement or flexor tenosynovitis.  Plan to I&D and discharge home with already prescribed bactrim.    Incision/Drainage  Date/Time: 02/09/2018 1:31 AM  Performed by: Jocelyn Lamer  Authorized by: Jocelyn Lamer     Consent:     Consent obtained:  Verbal    Consent given by:  Patient    Risks discussed:  Bleeding, damage to other organs, pain and incomplete drainage  Location:     Type:  Abscess    Size:  2 cm    Location:  Upper extremity    Upper extremity location:  Arm    Arm location:  L lower arm  Pre-procedure details:     Skin preparation:  Chloraprep  Anesthesia (see MAR for exact dosages):     Anesthesia method:  Local infiltration    Local anesthetic:  Lidocaine 1% WITH epi  Procedure details:     Incision types:  Single straight    Incision depth:  Subcutaneous    Scalpel blade:  11    Wound management:  Probed and deloculated and irrigated with saline    Drainage:  Purulent    Drainage amount:  Moderate    Wound treatment:  Wound left open    Packing materials:  None  Post-procedure details:     Patient tolerance of procedure:  Tolerated well, no immediate complications      ____________________________________________         History     Chief Complaint  Abscess      HPI   Glenn Kennedy is a 37 y.o. male with PMH of HTN, migraines who presents to the [facility] Emergency Department for an abscess to his left wrist. He reports he was recently seen and diagnosed with cellulitis to his left wrist, for which he was given bactrim. He reports the area has become more swollen and red, and endorses some drainage from the area 2 days ago.  Pain is not made worse with use of the hand.  Patient can use his hand without difficulty.  No fevers or chills.  No nausea or vomiting.  No systemic symptoms.  Patient reports pain is sharp, moderate and constant.    Past Medical History  Past Medical History:   Diagnosis Date   ??? Hypertension    ??? Migraines    ??? Migraines    ??? Neck pain, chronic        Past Surgical History  No past surgical history on file.    Medications    Current Facility-Administered Medications:   ???  lidocaine-EPINEPHrine (XYLOCAINE W/EPI) 1 %-1:100,000 injection, , , ,     Current Outpatient Prescriptions:   ???  amLODIPine (NORVASC) 5 MG  tablet, Take 1 tablet (5 mg total) by mouth daily., Disp: 90 tablet, Rfl: 3  ???  diphenhydrAMINE (BENADRYL) 25 mg capsule, Take 1 capsule (25 mg total) by mouth every four (4) hours as needed for itching (headache)., Disp: 30 capsule, Rfl: 0  ???  galcanezumab-gnlm (EMGALITY) injection, Inject 120 mg under the skin every thirty (30) days., Disp: 3 mL, Rfl: 3  ???  ketorolac (TORADOL) 10 mg tablet, , Disp: , Rfl: 0  ???  nicotine (NICODERM CQ) 7 mg/24 hr patch, Place 1 patch on the skin daily., Disp: 28 patch, Rfl: 0  ???  promethazine (PHENERGAN) 25 MG tablet, Take 1 tablet (25 mg total) by mouth every eight (8) hours as needed for nausea (HJeadache)., Disp: 30 tablet, Rfl: 3  ???  propranolol (INDERAL) 80 MG tablet, Take 1 tablet (80 mg total) by mouth Two (2) times a day., Disp: 60 tablet, Rfl: 11  ???  rizatriptan (MAXALT-MLT) 10 MG disintegrating tablet, Take 1 tablet (10 mg total) by mouth once as needed for migraine. May repeat in 2 hours if needed, Disp: 30 tablet, Rfl: 3  ??? sulfamethoxazole-trimethoprim (BACTRIM DS) 800-160 mg per tablet, Take 1 tablet (160 mg of trimethoprim total) by mouth Two (2) times a day. for 10 days, Disp: 19 tablet, Rfl: 0    Allergies  Patient has no known allergies.    Family History  Family History   Problem Relation Age of Onset   ??? Stroke Mother 65        hemorrhagic   ??? Hyperlipidemia Mother    ??? Hypertension Mother    ??? Migraines Mother    ??? No Known Problems Father    ??? No Known Problems Brother    ??? Migraines Son    ??? No Known Problems Maternal Grandmother    ??? No Known Problems Maternal Grandfather        Social History  Social History   Substance Use Topics   ??? Smoking status: Current Every Day Smoker     Packs/day: 0.50     Years: 15.00     Types: Cigarettes     Start date: 06/01/1997   ??? Smokeless tobacco: Never Used   ??? Alcohol use No       Review of Systems:   Pertinent positives and negatives are documented as per the HPI, all other systems reviewed are negative.   Constitutional: Negative for fever.  Eyes: Negative for blurred vision.  ENT: Negative for congestion.  Cardiovascular: Negative for palpitations.  Respiratory: Negative for hemoptysis.  Gastrointestinal: Negative for nausea/vomiting.  Genitourinary: Negative for dysuria.  Musculoskeletal: Positive for abscess to left wrist. Negative for joint pain.  Skin: Negative for rash.  Neurological: Negative for slurred speech.    Physical Exam     VITAL SIGNS:    ED Triage Vitals [02/08/18 2356]   Enc Vitals Group      BP 132/78      Heart Rate 89      SpO2 Pulse       Resp 16      Temp 36.1 ??C (97 ??F)      Temp Source Oral      SpO2 98 %     Constitutional: Alert and oriented. Well appearing and in no distress.  Eyes: Conjunctivae are normal. PERRL  ENT       Head: Normocephalic and atraumatic.       Nose: No congestion.       Mouth/Throat: Mucous membranes  are moist.       Neck: No stridor.  Hematological/Lymphatic/Immunilogical: No cervical lymphadenopathy.  Cardiovascular: Normal rate, regular rhythm. Normal and symmetric distal pulses are present in all extremities.  Respiratory: Normal respiratory effort. Breath sounds are normal.  Gastrointestinal: Soft, non tender, non distend, without rebound or guarding.  Musculoskeletal: 2cm to the back of left wrist over distal ulnar aspect. Swelling extending into hand and forearm. Erythema only over fluctuant area.  Neurologic: Normal speech and language. No gross focal neurologic deficits are appreciated.  Skin: Skin is warm, dry and intact. No rash noted.      Pertinent labs & imaging results that were available during my care of the patient were reviewed by me and considered in my medical decision making (see chart for details).    Documentation assistance was provided by Rutha Bouchard, Scribe on February 09, 2018 at 1:05 AM for  Oren Beckmann, MD.   February 09, 2018 1:30 AM. Documentation assistance provided by the scribe. I was present during the time the encounter was recorded. The information recorded by the scribe was done at my direction and has been reviewed and validated by me. Oren Beckmann, MD       Jocelyn Lamer, MD  02/09/18 [phone]

## 2018-02-09 NOTE — Unmapped (Signed)
Pt to ED for left wrist abscess.  Was seen here recently and placed on antibiotics, but redness and swelling has increased

## 2018-02-09 NOTE — Unmapped (Signed)
Discharge completed and all questions answered. Pt given all paperwork and follow up info including phone numbers. PT is alert and oriented at discharge. Pt denies questions and left with steady gait in no acute distress. All patient belongings sent home with PT.

## 2018-02-19 NOTE — Unmapped (Signed)
[facility]   EXTENSIVIST PROGRAM CASE MANAGEMENT   Brief Note    RN CM left message with patient to return call to the clinic to schedule a follow-up ED visit appointment with PCP as well as check in to assess any care coordination needs. Will try again.    Plan:     1. Care Management to: Follow up in 1-2 weeks  2. Patient to: Return call to CM.    Discuss at next outreach: PHQ-2 and ADLs/IADLs    Care Coordination Note updated in [facility]: Yes

## 2018-02-27 NOTE — Unmapped (Signed)
[facility]   EXTENSIVIST PROGRAM CASE MANAGEMENT   Brief Note    Patient called RN CM to inform that he is moving to [place] this weekend with his brother and is requesting his medical records. RN CM reviewed medication list and ensured patient has refills on all medications. Patient reports I'm good for now. RN CM assessed abscess and completion of Bactrim and patient reports minimal redness, no pain or swelling of left arm/wrist.  Advised patient to come to clinic after 2pm tomorrow to receive pertinent records and spend time with RN CM reviewing headache plan, contact information, and coordinate care for [facility] refills through [facility]. Will graduate patient from extensivist program at appointment. No further questions at this time.    Plan:     1. Care Management to: Route note to Care Team  2. Patient to: come to clinic 02/28/18 to meet with RN CM.    Discuss at next outreach: Graduation from Extensivist.    Care Coordination Note updated in [facility]: Yes

## 2018-03-07 NOTE — Unmapped (Signed)
[facility]   EXTENSIVIST PROGRAM CASE MANAGEMENT   Brief Note    RN CM returned call to patient who left a message. Patient reports he is now in [place] with his brother and he was unable to pick up paperwork at the clinic prior to leaving for [place]. Patient asked RN CM if this could be mailed as well as if coordination could be completed with Pharmacy Services to have [place] mailed to patient. RN CM will contact [facility] pharmacy to coordinate and update patient contact information and mail paperwork. Patient verbalized understanding. RN CM will call patient next week to complete a graduation call with patient. Patient is in agreement. Will continue to monitor.    Plan:     1. Care Management to: Follow up in 1-2 weeks and mail paperwork and contact [facility] to update address for medication to be shipped to patient.    Discuss at next outreach: Graduation from extensivist program, and review headache plan.    Care Coordination Note updated in [facility]: Yes

## 2018-03-08 NOTE — Unmapped (Signed)
[facility]  EXTENSIVIST PROGRAM CASE MANAGEMENT   Final Call- Program Graduation   Summary:   RN CM spoke with patient today to complete Extensivist Program Case Management services. Patient currently resides at Home with brother in [place]. Address updated in patient chart to reflect address change.    How would you rate your overall health right now?  (Scale of 1-5) 4- Very Good     Health Literacy: How confident are you that you understand your health issues/concerns, can participate in your care, and manage your care along with your physician (Scale of 1-3): 2- Somewhat confident    Goals:    RN CM congratulated patient on the resolution of the following healthcare barriers and/or goals:     Goal/Barrier 1: Improve chronic condition management  Goal/Barrier 2: Optimize/educate appropriate utilization and Improve medication adherence    Graduation Checklist:  ?? Does patient have COPD/CHF/ESRD? no   -If COPD, is pulmonary appointment or COPD specific appointment with PCP  scheduled? N/A   - If CHF, is cardiology appointment scheduled? N/A   - If ESRD, is nephrology appointment scheduled? N/A  ??  Does patient have any other questions about his or her health? yes, patient wanted to see if [employer] can be mailed to patient's new address in [place], however, according to [employer] Dorene Grebe), medications cannot be shipped outside of [place]. Patient informed and advised to continue to have medications sent to [facility] address (his parents home) and have family ship medication down to him. Verbalized understanding.    Plan:   Provided PCP office number and after-hours nurse line information for future care needs     Care Coordination Note updated in [facility]: Yes

## 2018-03-20 NOTE — Unmapped (Signed)
[facility] Refill and Clinical Coordination Note  Medication(s): [employer], DOB: [birth year]  Phone: [phone] (home) , Alternate phone contact: N/A  Shipping address: [address]  [place] [address]  Phone or address changes today?: No  All above HIPAA information verified.  Insurance changes? No    Completed refill and clinical call assessment today to schedule patient's medication shipment from the [facility] [phone]).      MEDICATION RECONCILIATION    Confirmed the medication and dosage are correct and have not changed: Yes, regimen is correct and unchanged.    Were there any changes to your medication(s) in the past month:  No, there are no changes reported at this time.    ADHERENCE    Is this medicine transplant or covered by Medicare Part B? No.    Did you miss any doses in the past 4 weeks? No missed doses reported.  Adherence counseling provided? Not needed     SIDE EFFECT MANAGEMENT    Are you tolerating your medication?:  Iaan reports tolerating the medication.  Side effect management discussed: None      Therapy is appropriate and should be continued.    Evidence of clinical benefit: See Epic note from 01/09/18      FINANCIAL/SHIPPING    Delivery Scheduled: Yes, Expected medication delivery date: Friday, May 10   Additional medications refilled: No additional medications/refills needed at this time.    The patient will receive an FSI print out for each medication shipped and additional FDA Medication Guides as required.  Patient education from [place] or Robet Leu may also be included in the shipment.    Jahquez did not have any additional questions at this time.    Delivery address validated in FSI scheduling system: Yes, address listed above is correct.      We will follow up with patient monthly for standard refill processing and delivery.      Thank you,  Tawanna Solo Shared [facility] Specialty Pharmacist

## 2018-05-31 NOTE — Unmapped (Signed)
Patient is unable to pay account balance to send medication again - they ask that we call them in ~1 month to see if things have changed then.  Adjusting specialty pharmacy outreach call dates.

## 2018-06-07 NOTE — Unmapped (Signed)
-----   Message from Audie Clear sent at 06/07/2018 11:09 AM EDT -----  Regarding: RE: VM From Patient for a Phone Call  I left a message on VM to return my call.  ----- Message -----  From: Junita Push, RN  Sent: 06/06/2018   4:49 PM  To: Audie Clear, Roma Schanz, MD  Subject: VM From Patient for a Phone Call                 Good afternoon,  I have received one voicemail and one text message from this patient to please contact at [phone].  Thank you,  Shanda Bumps

## 2018-06-07 NOTE — Unmapped (Signed)
Called patient.  Glenn Kennedy reported he called because he has moved back yesterday because having headaches again.  No longer on Emgality because needs to pay some money before he can get more sent.  Reports he needs to pay $20-30 before some more can be sent to him.  Reports that he will try to come up with the money before next month.  Trying to work it out with mom.     Offered appt at 1:2pm but cannot make it. Offered to be added on on Friday, but he thinks he can wait until Monday.  I don't have clinic on Mondays, but is scheduled in [facility] and can have acute headache protocol if indicated.  If headache bad on Friday, instructed to call Lolita Cram to be added on to my clinic.

## 2018-06-15 MED ORDER — KETOROLAC 10 MG TABLET
ORAL_TABLET | 0 refills | 0 days | Status: CP
Start: 2018-06-15 — End: 2018-12-21

## 2018-06-15 MED ORDER — AMLODIPINE 5 MG TABLET
ORAL_TABLET | Freq: Every day | ORAL | 0 refills | 0 days | Status: CP
Start: 2018-06-15 — End: 2018-08-27

## 2018-06-15 MED ORDER — RIZATRIPTAN 10 MG DISINTEGRATING TABLET
ORAL_TABLET | Freq: Once | ORAL | 0 refills | 0.00000 days | Status: CP | PRN
Start: 2018-06-15 — End: 2018-12-21

## 2018-06-15 MED ORDER — PROPRANOLOL 80 MG TABLET
ORAL_TABLET | Freq: Two times a day (BID) | ORAL | 5 refills | 0.00000 days | Status: CP
Start: 2018-06-15 — End: 2018-06-15

## 2018-06-15 MED ORDER — PROPRANOLOL 80 MG TABLET: 80 mg | tablet | Freq: Two times a day (BID) | 5 refills | 0 days | Status: AC

## 2018-06-15 MED ORDER — PROMETHAZINE 25 MG TABLET
ORAL_TABLET | 3 refills | 0 days | Status: CP
Start: 2018-06-15 — End: 2018-08-16

## 2018-06-15 NOTE — Unmapped (Signed)
I talked to the mom this morning and she only mentioned one medication needing refills.

## 2018-06-15 NOTE — Unmapped (Addendum)
Spoke to pt's mom and pt. Pt requests refills on all medications except the Benadryl. Pt was seen by PCP 11/2017. Also discussed CCM program. Pt consents.  Pt has moved back from [place] and is living with [place].  Pt was distracted and unable to discuss medications. Liborio Nixon did say he's having more frequent headaches we discussed that we want to keep him out of the Emergency room.  Pt will call [facility] for acute episodes.     Returned call to pt and he confirms he has not had recent HAs, nor the aura of having a high blood pressure. He will come to [facility] if status changes.     Follow up appt made for 07/20/18.     Will notify provider and follow up.     call, 15  min chart review.   Followed by 30 min calls (pt, pharmacy x2.) Chart reviews 15.

## 2018-06-18 NOTE — Unmapped (Signed)
I was the supervising physician in the delivery of the service. Kailand Seda D Avalie Oconnor, MD

## 2018-06-23 NOTE — Unmapped (Signed)
Already refilled on 06/15/2018

## 2018-06-29 NOTE — Unmapped (Signed)
Glenn Kennedy stated he did not wish to reorder his medication at this point and would call us back once he is ready to proceed with filling his medication. Unenrolling from specialty pharmacy outreach calls at this point in time.

## 2018-08-27 MED ORDER — AMLODIPINE 5 MG TABLET
ORAL_TABLET | 0 refills | 0 days | Status: CP
Start: 2018-08-27 — End: 2018-12-21

## 2018-08-27 MED ORDER — AMLODIPINE 5 MG TABLET: tablet | 0 refills | 0 days | Status: AC

## 2018-12-21 ENCOUNTER — Ambulatory Visit: Admit: 2018-12-21 | Discharge: 2018-12-22 | Payer: MEDICARE

## 2018-12-21 DIAGNOSIS — Z87891 Personal history of nicotine dependence: Secondary | ICD-10-CM

## 2018-12-21 DIAGNOSIS — N183 Chronic kidney disease, stage 3 (moderate): Secondary | ICD-10-CM

## 2018-12-21 DIAGNOSIS — G8929 Other chronic pain: Secondary | ICD-10-CM

## 2018-12-21 DIAGNOSIS — M542 Cervicalgia: Secondary | ICD-10-CM

## 2018-12-21 DIAGNOSIS — I10 Essential (primary) hypertension: Principal | ICD-10-CM

## 2018-12-21 DIAGNOSIS — G43709 Chronic migraine without aura, not intractable, without status migrainosus: Secondary | ICD-10-CM

## 2018-12-21 MED ORDER — TOPIRAMATE 100 MG TABLET
ORAL_TABLET | Freq: Two times a day (BID) | ORAL | 3 refills | 0 days | Status: CP
Start: 2018-12-21 — End: 2019-07-02

## 2018-12-21 MED ORDER — PROPRANOLOL 80 MG TABLET
ORAL_TABLET | Freq: Two times a day (BID) | ORAL | 3 refills | 0.00000 days | Status: CP
Start: 2018-12-21 — End: 2019-12-21

## 2018-12-21 MED ORDER — KETOROLAC 10 MG TABLET
ORAL_TABLET | 0 refills | 0 days | Status: CP
Start: 2018-12-21 — End: 2019-02-19

## 2018-12-21 MED ORDER — PROPRANOLOL 20 MG TABLET
ORAL_TABLET | Freq: Two times a day (BID) | ORAL | 3 refills | 0 days | Status: CP
Start: 2018-12-21 — End: 2018-12-21

## 2018-12-21 MED ORDER — RIZATRIPTAN 10 MG DISINTEGRATING TABLET
ORAL_TABLET | Freq: Once | ORAL | 0 refills | 0 days | Status: CP | PRN
Start: 2018-12-21 — End: 2019-01-18

## 2018-12-21 MED ORDER — TIZANIDINE 2 MG TABLET
ORAL_TABLET | Freq: Three times a day (TID) | ORAL | 3 refills | 0.00000 days | Status: CP | PRN
Start: 2018-12-21 — End: 2019-01-18

## 2018-12-21 MED ORDER — AMLODIPINE 5 MG TABLET
ORAL_TABLET | Freq: Every day | ORAL | 3 refills | 0 days | Status: CP
Start: 2018-12-21 — End: 2019-03-11

## 2018-12-27 MED ORDER — IBUPROFEN 600 MG TABLET
ORAL_TABLET | Freq: Three times a day (TID) | ORAL | 1 refills | 0.00000 days | Status: CP | PRN
Start: 2018-12-27 — End: ?

## 2019-01-17 NOTE — Unmapped (Signed)
NEUROLOGY REPORT    [facility] [place]  [address]  [place] [address]  [phone]    Date: January 21, 2019  Patient Name: Glenn Kennedy  MRN: [id number]  PCP:  Kurtis Bushman  Prior Visit: 12/29/17 (Botox) ; Nov 2017    Assessment and Plan/ Clinical Decision Making          1. Chronic migraine without aura without status migrainosus, not intractable    Mr. Sandoz is a 38 y.o. right handed man with mild intellectual disability, who lives with his very supportive mother. Last seen over year ago, Mr. Nannini has continued to struggle with his migraines, since being back in [place]- he did really well on Emgality, with almost no headaches, but moved to [place] and did not receive it for 7-8 months while there. He would like to resume Emgality for migraine prevention.     He has failed:   beta-blocker 80 mg twice a day- not helping him at all.    Botox- some improvement, incomplete.   Galcanezumab- patient states marked improvement (no calendar today)      PLAN: Resume Emgality- will need to load again. His mother is able to administer medications.   Will need to keep HA calendar AND bring to the next visit.     Acute abortive Rx with Rizatriptan which works well for him.     - galcanezumab-gnlm (EMGALITY) 120 mg/mL injection; Inject the contents of 2 pens (240 mg) under the skin once for 1 dose.  Dispense: 2 mL; Refill: 0  - galcanezumab-gnlm (EMGALITY) 120 mg/mL injection; Inject the contents of 1 pen (120 mg) under the skin every thirty (30) days.  Dispense: 1 mL; Refill: 11  - rizatriptan (MAXALT-MLT) 10 MG disintegrating tablet; Take 1 tablet (10 mg total) by mouth once as needed for migraine. May repeat in 2 hours if needed  Dispense: 30 tablet; Refill: 0    2. Neck pain, chronic  Muscle relaxant to help.   - tiZANidine (ZANAFLEX) 2 MG tablet; Take 1 tablet (2 mg total) by mouth every eight (8) hours as needed.  Dispense: 60 tablet; Refill: 3    HEALTH EDUCATION/HEALTH LITERACY: PATIENT EDUCATION was provided. Reviewed the differential diagnosis, plan of care in detail, as well as other elements as detailed above. The benefits and risks of each of the above procedures, benefits and alternative options were reviewed with the patient. Medication Management: There is a high risk for medication side effects, which will require ongoing monitoring and dose adjustment. Medication side effect profile was reviewed in detail with patient. Medication safety and drug interactions reviewed on ERx.  Patient Counselling: All the patient's questions and concerns were addressed to their stated satisfaction .    Barriers to Care: Unclear occupation/social barriers.   Total face-to-face time included: 25 minutes > 50 % in direct consultation reviewing details of history, examination and data findings, discussing my clinical reasoning and clinical impression, as well as a review of my recommendations for a plan of treatment as documented above. Additional time was provided to address alternative options for care, health literacy regarding the differential diagnosis, testing and medication options, the benefits of current plan as well as an opportunity to address all the patient's questions and concerns to their reported satisfaction. A written summary was provided to the patient at the time of the visit, as well as instructions on how to access My Chart. My contact information was provided along  with instructions on how to reach the administrative office and the clinic.     Return in about 9 weeks (around 03/22/2019) for Return for re-evaluation..         Subjective:          SOURCE: The history is obtained from the patient - reliability of recall of meds is not clear. Mother with him for the visit. Additional history is obtained from review of available medical records. Open-ended and close-ended questions, as well as questionnaire data and review of EPIC chart was used to obtain details of the history. History of Present Illness:     Todays visit:   He is back in [place]- moved to [place] and was not able to get Emgality there- while on Emgality had no headaches.   Would like to resume- no side effects.   No new problems. No ED visits for headache (see original visit notes below- this had been a major factor in his headache symptoms).   Sleep is good    Has tried the following  Abortive Therapy:   Analgesics/NSAIDS- multiple, including toradol, medrol dosepak  Ergots- unknown  Triptans- current effective.        Prophylactic Therapy:  Propranolol- ineffective 80 MG BID  Calcium Channel Blocker- unknown  Botox- not fully effective.   Emgality- highly effective.         Prior visit (2019)  He has been to the emergency room some 24 times since the start of this year.  Each time his headache is the same and last several days.  The headache is holocephalic such that he cannot function.  Associated with photophobia, phonophobia, improved with lying down and sleep and aggravated by movement.  He has been on a beta-blocker recently increased to 80 mg twice daily.  No side effects but no improvement.  Unclear if he is taking his abortive therapy.    He was using Seroquel last year when I prescribed for him but he ran out very quickly raising red flags for overuse.  Therefore Seroquel is no longer an option for him.    Severity: Affect ADLS- missed work/school/ ER visit  Timing: later in the day.   Quality: burning- points to the occipital region.  Duration: hours.   Frequency: unclear  Context: no trauma  Modifying factors: sleep; worse if sleeps in certain position.   Recent changes in frequency (Last 3 months): none  Associated Symptoms: No associated symptoms.     Number of headache free days in last 30 days: none  ADLs/QOL: unable to function. Has to sleep.         Sleep: feel refreshed some of the time.   Imaging/Other studies/data: 12/2013- Head CT (results reviewed).     Childhood history (from mother): one of two (eldest); healthy, term baby, no concussions/no meningitis/encephalitis. Started in [employer]- maybe once a month. Responded to OTC. Had to go to sleep.     Past Medical History:   Diagnosis Date   ??? Hypertension    ??? Migraines    ??? Neck pain, chronic        No past surgical history on file.    Social History     Social History Narrative    12/24/18    Pt has moved back to Mom's house in [place].    Does not plan on going back to [place] anytime soon.       Social History     Tobacco Use   Smoking Status Current  Every Day Smoker   ??? Packs/day: 0.50   ??? Years: 15.00   ??? Pack years: 7.50   ??? Types: Cigarettes   ??? Start date: 06/01/1997   Smokeless Tobacco Never Used         reports no history of alcohol use.      Family History   Problem Relation Age of Onset   ??? Stroke Mother 70        hemorrhagic   ??? Hyperlipidemia Mother    ??? Hypertension Mother    ??? Migraines Mother    ??? No Known Problems Father    ??? No Known Problems Brother    ??? Migraines Son    ??? No Known Problems Maternal Grandmother    ??? No Known Problems Maternal Grandfather        No Known Allergies    Current Outpatient Medications   Medication Sig Dispense Refill   ??? amLODIPine (NORVASC) 5 MG tablet Take 1 tablet (5 mg total) by mouth daily. TAKE 1 TABLET(5 MG) BY MOUTH DAILY 90 tablet 3   ??? diphenhydrAMINE (BENADRYL) 25 mg capsule Take 1 capsule (25 mg total) by mouth every four (4) hours as needed for itching (headache). 30 capsule 0   ??? galcanezumab-gnlm (EMGALITY) 120 mg/mL injection Inject the contents of 2 pens (240 mg) under the skin once for 1 dose. 2 mL 0   ??? [START ON 02/16/2019] galcanezumab-gnlm (EMGALITY) 120 mg/mL injection Inject the contents of 1 pen (120 mg) under the skin every thirty (30) days. 1 mL 11   ??? ibuprofen (MOTRIN) 600 MG tablet Take 1 tablet (600 mg total) by mouth every eight (8) hours as needed for pain (take as needed for Headache). 90 tablet 1   ??? ketorolac (TORADOL) 10 mg tablet Take one tablet every 8 hours as needed for headache -may take with phenergan (promethazine). 30 tablet 0   ??? propranolol (INDERAL) 80 MG tablet Take 1 tablet (80 mg total) by mouth Two (2) times a day. 180 tablet 3   ??? rizatriptan (MAXALT-MLT) 10 MG disintegrating tablet Take 1 tablet (10 mg total) by mouth once as needed for migraine. May repeat in 2 hours if needed 30 tablet 0   ??? tiZANidine (ZANAFLEX) 2 MG tablet Take 1 tablet (2 mg total) by mouth every eight (8) hours as needed. 60 tablet 3   ??? topiramate (TOPAMAX) 100 MG tablet Take 1 tablet (100 mg total) by mouth Two (2) times a day. 180 tablet 3     No current facility-administered medications for this visit.      Review of Systems: A 10-systems review was performed.  Review of Systems   Constitutional: Negative for fever and weight loss.   HENT: Negative for ear discharge, hearing loss, sore throat and tinnitus.    Eyes: Positive for photophobia. Negative for double vision.   Respiratory: Negative for cough.    Cardiovascular: Negative for chest pain and palpitations.   Gastrointestinal: Positive for heartburn.   Genitourinary: Negative for dysuria and frequency.   Musculoskeletal: Positive for neck pain.   Skin: Negative for rash.   Neurological: Positive for headaches. Negative for seizures and loss of consciousness.   Endo/Heme/Allergies: Does not bruise/bleed easily.   Psychiatric/Behavioral: Negative for depression, hallucinations and suicidal ideas. The patient does not have insomnia.      Unless otherwise identified here, in HPI or by patient  no additional symptoms identified.          Objective:  GENERAL PHYSICAL EXAM:    Vital Signs: Reviewed today.   BP 146/80  - Pulse 92  - Resp 16  - Ht 170.2 cm (5' 7)  - Wt 88 kg (194 lb)  - SpO2 96%  - BMI 30.38 kg/m??     Estimated body mass index is 30.38 kg/m?? as calculated from the following:    Height as of this encounter: 170.2 cm (5' 7).    Weight as of this encounter: 88 kg (194 lb).  Facility age limit for growth percentiles is 20 years.    Wt Readings from Last 8 Encounters:   01/18/19 88 kg (194 lb)   12/21/18 88.1 kg (194 lb 4.8 oz)   01/09/18 93.4 kg (206 lb)   12/13/17 94.8 kg (209 lb)   12/05/17 92.2 kg (203 lb 3.2 oz)   10/27/17 90.4 kg (199 lb 4.7 oz)   10/22/17 89.4 kg (197 lb)   10/06/17 91.6 kg (201 lb 15.1 oz)     General Appearance: The patient was lying curled up on the examination table, in a dark room with lights off.  He appears uncomfortable.    Head:  Atraumatic, normocephalic.  ENT:  No abnormalities.    NEUROLOGICAL EXAMINATION   Mental Status  The patient is alert and oriented to time, place and person.   Attention/concentration adequate throughout the examination.   Speech and language were normal at the bedside.  Memory was intact for history, short and long-term.   Affect is appropriate   Mood does not appear depressed.    Insight/Judgement: Appropriate.   Thought process: Logical, linear, clear, coherent, goal directed.     Motor / Musculoskeletal     Gait: Normal gait.       Diagnostic Studies and Review of Records:     DATA   I have independently reviewed the studies as noted.   1. Medical Records: reviewed- multiple ED visits reviewed. Recently- no ED visit.   2. Personal communication with his PCP  3. Labs: reviewed  Lab Results   Component Value Date    WBC 9.0 12/07/2017    HGB 13.7 12/07/2017    HCT 40.5 (L) 12/07/2017    PLT 210 12/07/2017       Lab Results   Component Value Date    NA 143 12/20/2017    K 3.6 12/20/2017    CL 112 (H) 12/07/2017    CO2 21.0 (L) 12/07/2017    BUN 18 12/07/2017    CREATININE 1.60 (H) 12/20/2017    GLU 118 12/07/2017    CALCIUM 9.9 12/07/2017       Lab Results   Component Value Date    BILITOT 0.3 12/05/2017    PROT 7.0 12/05/2017    ALBUMIN 4.1 12/05/2017    ALT 40 12/05/2017    AST 30 12/05/2017    ALKPHOS 144 (H) 12/05/2017       No results found for: LABPROT, INR, APTT  4. Radiology: Results reviewed  INTERPRETATION LOCATION: [facility]    DICTATED: Thursday, December 26, 2013 23:46:38 CLINICAL INDICATION: 38 year-old M. ?? - , HEADACHE RECURRENT OR KNOWN DX MIGRAINES, .    TECHNIQUE: Axial 4.8-mm images from skull base to vertex without contrast.     For all [facility] CT exams, radiation dose reduction device (automated exposure control) is used or manual techniques with radiation dose As Low As Reasonably Achievable (ALARA) protocol are followed using age and patient-size-specific scan parameters, while maintaining the necessary diagnostic  image quality.    COMPARISON: None.    FINDINGS: No evidence of intracranial bleed or acute infarct. No intracranial mass identified or midline shift. No fracture identified. The paranasal sinuses are pneumatized.     IMPRESSION:    No evidence of acute intracranial pathology.         Specimen Collected: 12/26/13 23:46 Last Resulted: 12/27/13 08:57          Dr. Hollie Beach    CC: Kurtis Bushman, MD    Dictated using voice-activated software. Typographical errors may exist.

## 2019-01-18 ENCOUNTER — Ambulatory Visit: Admit: 2019-01-18 | Discharge: 2019-01-19 | Payer: MEDICARE | Attending: Neurology | Primary: Neurology

## 2019-01-18 DIAGNOSIS — G8929 Other chronic pain: Secondary | ICD-10-CM

## 2019-01-18 DIAGNOSIS — G43709 Chronic migraine without aura, not intractable, without status migrainosus: Principal | ICD-10-CM

## 2019-01-18 DIAGNOSIS — M542 Cervicalgia: Secondary | ICD-10-CM

## 2019-01-18 MED ORDER — RIZATRIPTAN 10 MG DISINTEGRATING TABLET
ORAL_TABLET | Freq: Once | ORAL | 0 refills | 0 days | Status: CP | PRN
Start: 2019-01-18 — End: 2019-04-08
  Filled 2019-02-14: qty 18, 30d supply, fill #0

## 2019-01-18 MED ORDER — GALCANEZUMAB-GNLM 120 MG/ML SUBCUTANEOUS PEN INJECTOR
Freq: Once | SUBCUTANEOUS | 0 refills | 0.00000 days | Status: CP
Start: 2019-01-18 — End: 2019-04-24
  Filled 2019-01-22: qty 2, 28d supply, fill #0

## 2019-01-18 MED ORDER — TIZANIDINE 2 MG TABLET
ORAL_TABLET | Freq: Three times a day (TID) | ORAL | 3 refills | 0.00000 days | Status: CP | PRN
Start: 2019-01-18 — End: 2019-03-11
  Filled 2019-02-14: qty 60, 20d supply, fill #0

## 2019-01-18 NOTE — Unmapped (Signed)
[facility] Specialty Medication Referral: No PA required    Medication (Brand/Generic): Emgality Loading and Maintenance Doses    Initial Benefits Investigation Claim completed with resulted information below:  No PA required  Patient ABLE to fill at [facility]  Insurance Company:  [employer]  Anticipated Copay: $3.90/each RX    As Co-pay is under $25 defined limit, per policy there will be no further investigation of need for financial assistance at this time unless patient requests. This referral has been communicated to the provider and handed off to the [facility] team for further processing and filling of prescribed medication.   ______________________________________________________________________  Please utilize this referral for viewing purposes as it will serve as the central location for all relevant documentation and updates.

## 2019-01-18 NOTE — Unmapped (Signed)
EMGALITY?? (em-GAL-it-?)  (galcanezumab-gnlm)  injection, for subcutaneous use  Prefilled Pen  This Instructions for Use is for patients with migraine.  For subcutaneous injection only.        ? You will receive your loading dose of Emgality - the first dose will be 2 injections, then one injection per month. REMEMBER 3 TEAL COLOR-CODED AREAS- Unlock, Uncover (needle) and the INJECT button (will click when you press and again when the injection is completed).   ? You should make a note of the date and try to administer the medication once a month on or about the same date every month. Alternate sides for injection sites (for example, even months, inject on the right and odd months, inject on the left).  ? Keep track of your headaches, using a chart (see below) or an app like Migraine Buddy. Bring the calendar with you to every visit.   ? Use a headache calendar, log or an app like MigraineBuddy on your smartdevice.   ? Keep this Instructions for Use and refer to it as needed.  ? Each EMGALITY Pen is for one-time use only. Do not share or reuse your EMGALITY Pen. You  ? may give or get an infection.  ? The Pen contains glass parts. Handle it carefully. If you drop it on a hard surface, do not use it. Use  ? a new Pen for your injection.  ? Your healthcare provider may help you decide where on your body to inject your dose. You can also  ? read the ???Choose your injection site??? section of these instructions to help you choose which area  ? can work best for you.  ? If you have vision or hearing problems, do not use EMGALITY Pen without help from a caregiver.  ? You may review additional information online at [facility]. You can also review additional information to help you pay for or afford the medication.     Please call our office with any questions/concerns.       Warmest Regards,    Dr. Hollie Beach  Board Certified: Adult Neurology  [facility] Division of General Medicine and Clinical Epidemiology  [place], [place] It has been my pleasure to participate in your care.   You may reach me via MyChart, phone or fax.     Please note that while I will try to answer messages in a timely manner, all urgent issues should be directed to your PCP.       Helpful Contact Information :  ?? Appointments/ Internal Medicine Clinic: [phone]    ?? HIPPA Compliant Facsimile: [phone]  ?? Same Day Clinic:  [phone].  8am-5pm Monday through Friday   ?? After Hours:  [phone].  A nurse will answer and can help you decide what kind of medication attention you need.   ?? [facility]:  [phone].  If you are sick, but not injured:  9am-8pm Mon-Sun.  [address], [place], [place] off of [address].  All walk-ins accepted.    ?? [facility] Ur[facility] at the [facility]:  [phone].  7am-9pm Mon-Fri; 12pm-5pm [address]  [address], [place] [address].  All walk-ins accepted.         Thank you for choosing [employer].

## 2019-01-21 DIAGNOSIS — G43709 Chronic migraine without aura, not intractable, without status migrainosus: Principal | ICD-10-CM

## 2019-01-21 NOTE — Unmapped (Signed)
Glenn Kennedy 's [facility] shipment will be delayed due to Prior Authorization Required We have contacted the patient and communicated the delivery change to patient/caregiver We will call the patient to reschedule the delivery upon resolution. We have confirmed the delivery date as NOT APPLICABLE .

## 2019-01-21 NOTE — Unmapped (Signed)
Glenn Kennedy declined counseling on Medication Administration and side effects.  Provider counseled Glenn Kennedy and his mother on medication administration and side effects in office.  He is aware that he can call [facility] pharmacy anytime with questions/concerns.     Glenn Kennedy will start medication 01/22/19.    Glenn Kennedy [facility]   Patient Onboarding/Medication Counseling    Glenn Kennedy is a 38 y.o. male with Migraines who I am counseling today on initiation of therapy.  I am speaking to the patient.    Verified patient's date of birth / HIPAA.    Specialty medication(s) to be sent: Neurology: Emgality      Non-specialty medications/supplies to be sent: sharps container      Medications not needed at this time: no other meds on file         Emgality (galcanezumab)    Medication & Administration     Dosage: Inject the contents of 2 pens (240mg ) under the skin as a single dose.for the loading dose.  Then inject the contents of 1 pen (120mg ) under the skin every thirty (30) days.    Administration: Inject under the skin of the abdomen, thigh, upper arm or buttocks.   ? Injection instructions (pen):  o Remove the Emgality pen from the refrigerator and allow to stand at room temperature for at least 30 minutes  o Examine the pen for the following:  ? Expiration date  ? Signs of damage  ? Medication is clear and colorless  o Choose your injection site and clean with an alcohol wipe. Allow to air dry completely.  o When you are ready to inject twist off the base cap and dispose of it  o Place and hold the clear base flat and firmly against your skin  o With your other hand, turn the lock ring to the unlock position  o Press and hold the teal button. You will hear a loud ???click.???  o Continue to hold the button, pressing the pen firmly against your skin  o You will hear a second ???click.??? This indicates the injection is complete.  o Remove the pen from your skin and dispose of it in a sharps container.  o If there is blood at the injection site, press a cotton ball or gauze gently to the site. Do not rub.  ? Injection instructions (prefilled syringe)  o Remove 1 syringe from the refrigerator and allow to stand at room temperature for at least 30 minutes  o Examine the syringe(s) for the following:  ? Expiration date  ? Signs of damage  ? Medication is clear and colorless (or slightly yellow to slightly brown) and free of particles  o Choose your injection site and clean with an alcohol wipe. Allow to air dry completely.  o When you are ready to inject, pull the gray needle cap straight off and discard  o Pinch a 2 inch fold of skin and insert the needle at a 45 degree angle  o Slowly push down on the teal (120mg ) or coral (100mg ) plunger all the way in until all of the medicine is injected.  ? You should see the teal/coral plunger rod show through the syringe body when the injection is complete.  o Slowly remove the needle from your skin and discard the syringe and needle in a sharps container.  o If there is blood at the injection site, press a cotton ball or gauze gently to the site. Do not rub.    Adherence/Missed dose  instructions: Administer a missed dose as soon as you remember. Schedule your monthly injections from the date you take your missed dose.    Goals of Therapy     Reduce the number of monthly migraines  Reduce weekly cluster headache attack frequency    Side Effects & Monitoring Parameters     ? Injection site irritation    The following side effects should be reported to the provider:  ? Signs of an allergic reaction      Contraindications, Warnings & Precautions     ? Polysorbate 80 (some dosage forms may contain polysorbate 80; hypersensitivity reactions, usually delayed, have occurred following exposure)  ? Cardiovascular disease (patients with history of CVD were excluded from trials; use with caution)    Drug/Food Interactions     ? Medication list reviewed in Epic. The patient was instructed to inform the care team before taking any new medications or supplements. No drug interactions identified.     Storage, Handling Precautions, & Disposal     ? Emgality should be stored in the refrigerator.  o If necessary, Emgality may be stored at room temperature, in the original carton, for up to 7 days.  ? Once removed from refrigeration do NOT return to the refrigerator  ? Place used pens/syringes in a sharps container for disposal      Current Medications (including OTC/herbals), Comorbidities and Allergies     Current Outpatient Medications   Medication Sig Dispense Refill   ??? amLODIPine (NORVASC) 5 MG tablet Take 1 tablet (5 mg total) by mouth daily. TAKE 1 TABLET(5 MG) BY MOUTH DAILY 90 tablet 3   ??? diphenhydrAMINE (BENADRYL) 25 mg capsule Take 1 capsule (25 mg total) by mouth every four (4) hours as needed for itching (headache). 30 capsule 0   ??? [START ON 02/16/2019] galcanezumab-gnlm (EMGALITY) 120 mg/mL injection Inject the contents of 1 pen (120 mg) under the skin every thirty (30) days. 1 mL 11   ??? ibuprofen (MOTRIN) 600 MG tablet Take 1 tablet (600 mg total) by mouth every eight (8) hours as needed for pain (take as needed for Headache). 90 tablet 1   ??? ketorolac (TORADOL) 10 mg tablet Take one tablet every 8 hours as needed for headache -may take with phenergan (promethazine). 30 tablet 0   ??? propranolol (INDERAL) 80 MG tablet Take 1 tablet (80 mg total) by mouth Two (2) times a day. 180 tablet 3   ??? rizatriptan (MAXALT-MLT) 10 MG disintegrating tablet Take 1 tablet (10 mg total) by mouth once as needed for migraine. May repeat in 2 hours if needed 30 tablet 0   ??? tiZANidine (ZANAFLEX) 2 MG tablet Take 1 tablet (2 mg total) by mouth every eight (8) hours as needed. 60 tablet 3   ??? topiramate (TOPAMAX) 100 MG tablet Take 1 tablet (100 mg total) by mouth Two (2) times a day. 180 tablet 3     No current facility-administered medications for this visit.        No Known Allergies    Patient Active Problem List   Diagnosis ??? Chronic migraine without aura without status migrainosus, not intractable   ??? Hypertension   ??? Neck pain, chronic   ??? Enrolled in chronic care management   ??? Personal history of tobacco use, presenting hazards to health   ??? CKD (chronic kidney disease) stage 3, GFR 30-59 ml/min (CMS-HCC)       Reviewed and up to date in Epic.    Appropriateness  of Therapy     Is medication and dose appropriate based on diagnosis? Yes    Baseline Quality of Life Assessment      How many days over the past month did your migraines keep you from your normal activities? Has headaches about every week that are difficult to carry out day to day activities.  Last episosde lasted 2 days.    Financial Information     Medication Assistance provided: None Required    Anticipated copay of $3.90 reviewed with patient. Verified delivery address.    Delivery Information     Scheduled delivery date: 01/22/19    Expected start date: 01/22/19    Medication will be delivered via Same Day Courier to the home address in [place].  This shipment will not require a signature.      Explained the services we provide at [facility] and that each month we would call to set up refills.  Stressed importance of returning phone calls so that we could ensure they receive their medications in time each month.  Informed patient that we should be setting up refills 7-10 days prior to when they will run out of medication.  A pharmacist will reach out to perform a clinical assessment periodically.  Informed patient that a welcome packet and a drug information handout will be sent.      Patient verbalized understanding of the above information as well as how to contact the pharmacy at [phone] option 4 with any questions/concerns.  The pharmacy is open Monday through Friday 8:30am-4:30pm.  A pharmacist is available 24/7 via pager to answer any clinical questions they may have.    Patient Specific Needs     ? Does the patient have any physical, cognitive, or cultural barriers? No    ? Patient prefers to have medications discussed with  Patient     ? Is the patient able to read and understand education materials at a high school level or above? No    ? Patient's primary language is  English     ? Is the patient high risk? No     ? Does the patient require a Care Management Plan? No     ? Does the patient require physician intervention or other additional services (i.e. nutrition, smoking cessation, social work)? No      Mikey Maffett [facility] Specialty Pharmacist

## 2019-01-22 MED FILL — EMGALITY PEN 120 MG/ML SUBCUTANEOUS PEN INJECTOR: 28 days supply | Qty: 2 | Fill #0 | Status: AC

## 2019-02-05 DIAGNOSIS — G8929 Other chronic pain: Principal | ICD-10-CM

## 2019-02-05 DIAGNOSIS — G43909 Migraine, unspecified, not intractable, without status migrainosus: Principal | ICD-10-CM

## 2019-02-05 DIAGNOSIS — I10 Essential (primary) hypertension: Principal | ICD-10-CM

## 2019-02-05 DIAGNOSIS — N183 Chronic kidney disease, stage 3 (moderate): Principal | ICD-10-CM

## 2019-02-05 DIAGNOSIS — F17210 Nicotine dependence, cigarettes, uncomplicated: Principal | ICD-10-CM

## 2019-02-05 DIAGNOSIS — M542 Cervicalgia: Principal | ICD-10-CM

## 2019-02-05 DIAGNOSIS — Z823 Family history of stroke: Principal | ICD-10-CM

## 2019-02-05 DIAGNOSIS — R51 Headache: Principal | ICD-10-CM

## 2019-02-05 DIAGNOSIS — I129 Hypertensive chronic kidney disease with stage 1 through stage 4 chronic kidney disease, or unspecified chronic kidney disease: Principal | ICD-10-CM

## 2019-02-05 DIAGNOSIS — R42 Dizziness and giddiness: Principal | ICD-10-CM

## 2019-02-06 ENCOUNTER — Ambulatory Visit
Admit: 2019-02-06 | Discharge: 2019-02-06 | Disposition: A | Discharge status: Home / Self Care | Payer: MEDICARE | Attending: Family

## 2019-02-06 NOTE — Unmapped (Signed)
[facility]  Emergency Department Provider Note      ED Clinical Impression     Final diagnoses:   Lightheadedness (Primary)   Chronic nonintractable headache, unspecified headache type       Initial Impression, ED Course, Assessment and Plan     Impression: Patient is a 38 y.o. male with PMH of    On exam, the patient appears well and is in NAD. Vital signs are within normal limits. Lungs clear to auscultation bilaterally. Heart rate and rhythm regular. CN III-XII intact, cerebellar normal, normal gait.      Exam overall benign.  No concern for dangerous etiology.    Will give tylenol and d/c with home management. Return precautions discussed.      ____________________________________________    Time seen: February 05, 2019 10:41 PM    I have reviewed the triage vital signs and the nursing notes.    This visit was not staffed with an ED attending.    Additional Medical Decision Making     I have reviewed the vital signs and the nursing notes. Labs and radiology results that were available during my care of the patient were independently reviewed by me and considered in my medical decision making.     I reviewed the patient's prior medical records (ED Provider Notes 02/09/2018).      History     Chief Complaint  Dizziness    HPI   Glenn Kennedy is a 38 y.o. male with a PMH of HTN, chronic neck pain, CKD stage III and chronic migraines presenting with headaches and dizziness. Patient reports his headaches began yesterday. They are similar to his typical migraines with associated photophobia. The pain is 4/10 in severity. He took [facility] powder for pain management. He had an episode of lightheadedness earlier this evening that has since resolved. No recent illness. He denies chest pain, abdominal pain, shortness of breath, nausea, vomiting, difficulty walking syncopal episodes, room spinning sensation or other medical concerns at this time.     Past Medical History:   Diagnosis Date   ??? Hypertension    ??? Migraines    ??? Neck pain, chronic        Patient Active Problem List   Diagnosis   ??? Chronic migraine without aura without status migrainosus, not intractable   ??? Hypertension   ??? Neck pain, chronic   ??? Enrolled in chronic care management   ??? Personal history of tobacco use, presenting hazards to health   ??? CKD (chronic kidney disease) stage 3, GFR 30-59 ml/min (CMS-HCC)     No past surgical history on file.    No current facility-administered medications for this encounter.     Current Outpatient Medications:   ???  amLODIPine (NORVASC) 5 MG tablet, Take 1 tablet (5 mg total) by mouth daily. TAKE 1 TABLET(5 MG) BY MOUTH DAILY, Disp: 90 tablet, Rfl: 3  ???  diphenhydrAMINE (BENADRYL) 25 mg capsule, Take 1 capsule (25 mg total) by mouth every four (4) hours as needed for itching (headache)., Disp: 30 capsule, Rfl: 0  ???  [START ON 02/16/2019] galcanezumab-gnlm (EMGALITY) 120 mg/mL injection, Inject the contents of 1 pen (120 mg) under the skin every thirty (30) days., Disp: 1 mL, Rfl: 11  ???  ibuprofen (MOTRIN) 600 MG tablet, Take 1 tablet (600 mg total) by mouth every eight (8) hours as needed for pain (take as needed for Headache)., Disp: 90 tablet, Rfl: 1  ???  ketorolac (TORADOL) 10 mg  tablet, Take one tablet every 8 hours as needed for headache -may take with phenergan (promethazine)., Disp: 30 tablet, Rfl: 0  ???  propranolol (INDERAL) 80 MG tablet, Take 1 tablet (80 mg total) by mouth Two (2) times a day., Disp: 180 tablet, Rfl: 3  ???  rizatriptan (MAXALT-MLT) 10 MG disintegrating tablet, Take 1 tablet (10 mg total) by mouth once as needed for migraine. May repeat in 2 hours if needed, Disp: 30 tablet, Rfl: 0  ???  tiZANidine (ZANAFLEX) 2 MG tablet, Take 1 tablet (2 mg total) by mouth every eight (8) hours as needed., Disp: 60 tablet, Rfl: 3  ???  topiramate (TOPAMAX) 100 MG tablet, Take 1 tablet (100 mg total) by mouth Two (2) times a day., Disp: 180 tablet, Rfl: 3    Allergies  Patient has no known allergies.    Family History   Problem Relation Age of Onset   ??? Stroke Mother 66        hemorrhagic   ??? Hyperlipidemia Mother    ??? Hypertension Mother    ??? Migraines Mother    ??? No Known Problems Father    ??? No Known Problems Brother    ??? Migraines Son    ??? No Known Problems Maternal Grandmother    ??? No Known Problems Maternal Grandfather        Social History  Social History     Tobacco Use   ??? Smoking status: Current Every Day Smoker     Packs/day: 0.50     Years: 15.00     Pack years: 7.50     Types: Cigarettes     Start date: 06/01/1997   ??? Smokeless tobacco: Never Used   Substance Use Topics   ??? Alcohol use: No     Alcohol/week: 0.0 standard drinks   ??? Drug use: No     Review of Systems    A complete review of systems was performed and is negative other than as addressed in the HPI.    Physical Exam     ED Triage Vitals [02/05/19 2110]   Enc Vitals Group      BP 138/94      Heart Rate 74      SpO2 Pulse       Resp 18      Temp 36.1 ??C (97 ??F)      Temp Source Oral      SpO2 97 %      Weight 72.6 kg (160 lb)      Height 1.702 m (5' 7)     Constitutional: Alert and oriented. Well appearing and in no distress.  Eyes: Conjunctivae are normal.  ENT       Head: Normocephalic and atraumatic.       Mouth/Throat: Mucous membranes are moist.       Neck: Supple  Hematological/Lymphatic/Immunilogical: No cervical lymphadenopathy.  Cardiovascular: Normal rate, regular rhythm.   Respiratory: Normal respiratory effort. Breath sounds are normal.  Gastrointestinal: Soft and nontender.   Musculoskeletal: Nontender with normal range of motion in all extremities.  Neurologic: Normal speech and language. No gross focal neurologic deficits are appreciated. Pupils are equal round reactive to light bilaterally.  Extraocular muscles are intact. No nystagmus.  Face symmetric. Normal facial sensation.  Normal strength with resisted head turning.  Normal shoulder shrug.  Symmetric palate elevation.  No pronator drift.  Normal finger to nose testing. Normal grip strength.  Normal flexion and extension of the upper extremities.  Normal flexion and extension of the lower extremities.  Normal gait.  2+ brachioradialis, patellar, and achilles reflexes bilaterally.  Skin: Skin is warm, dry and intact. No rash noted.  Psychiatric: Mood and affect are normal. Speech and behavior are normal.    Pertinent labs & imaging results that were available during my care of the patient were reviewed by me and considered in my medical decision making (see chart for details).    ______________________________________________________   Documentation assistance was provided by Lorie Apley, Scribe, on February 05, 2019 at 10:48 PM for Marcelle Overlie, FNP-BC    A scribe was used when documenting this visit. I agree with the above documentation. Signed by  Evaristo Bury on  February 05, 2019 at 11:12 PM           Evaristo Bury, FNP  02/05/19 2312

## 2019-02-06 NOTE — Unmapped (Signed)
Pt presents to ER alert and in NAD. Pt reports headache that started yesterday and dizziness that started today. Clear speech, steady gait.

## 2019-02-06 NOTE — Unmapped (Signed)
Bed: 03  Expected date:   Expected time:   Means of arrival:   Comments:  Hold

## 2019-02-07 DIAGNOSIS — G8929 Other chronic pain: Principal | ICD-10-CM

## 2019-02-07 DIAGNOSIS — M542 Cervicalgia: Principal | ICD-10-CM

## 2019-02-07 DIAGNOSIS — N183 Chronic kidney disease, stage 3 (moderate): Principal | ICD-10-CM

## 2019-02-07 DIAGNOSIS — G43709 Chronic migraine without aura, not intractable, without status migrainosus: Principal | ICD-10-CM

## 2019-02-08 NOTE — Unmapped (Signed)
The Internal Medicine CCM team was unable to reach [facility] to follow-up in regards to the Chronic Care Management Program. A voicemail was left with a request to return our call.     F/u re: lightheadedness ER. & PCP f/u

## 2019-02-12 MED ORDER — AMLODIPINE 5 MG TABLET
ORAL_TABLET | Freq: Every day | ORAL | 3 refills | 0 days | Status: CP
Start: 2019-02-12 — End: 2020-02-12
  Filled 2019-02-14: qty 90, 90d supply, fill #0

## 2019-02-12 NOTE — Unmapped (Signed)
Per Dr. Gearlean Kennedy should continue with his maintenance injections for Emgality: 1 pen monthly (q 30 days per rx).    Glenn Kennedy has been notified that he does not need another LD and to continue with maintenance dosing.  He reports next injection as 03/05/19.  Last given on ~02/04/19

## 2019-02-12 NOTE — Unmapped (Signed)
Treylen reports medication squirted out of one pen.  Reaching out to provider.    [facility] Clinical Assessment & Refill Coordination Note    Glenn Kennedy, [place]: 1980-12-13  Phone: [phone] (home)     All above HIPAA information was verified with patient.     Specialty Medication(s):   Neurology: Emgality     Current Outpatient Medications   Medication Sig Dispense Refill   ??? amLODIPine (NORVASC) 5 MG tablet Take 1 tablet (5 mg total) by mouth daily. TAKE 1 TABLET(5 MG) BY MOUTH DAILY 90 tablet 3   ??? diphenhydrAMINE (BENADRYL) 25 mg capsule Take 1 capsule (25 mg total) by mouth every four (4) hours as needed for itching (headache). 30 capsule 0   ??? [START ON 02/16/2019] galcanezumab-gnlm (EMGALITY) 120 mg/mL injection Inject the contents of 1 pen (120 mg) under the skin every thirty (30) days. 1 mL 11   ??? ibuprofen (MOTRIN) 600 MG tablet Take 1 tablet (600 mg total) by mouth every eight (8) hours as needed for pain (take as needed for Headache). 90 tablet 1   ??? ketorolac (TORADOL) 10 mg tablet Take one tablet every 8 hours as needed for headache -may take with phenergan (promethazine). 30 tablet 0   ??? propranolol (INDERAL) 80 MG tablet Take 1 tablet (80 mg total) by mouth Two (2) times a day. 180 tablet 3   ??? rizatriptan (MAXALT-MLT) 10 MG disintegrating tablet Take 1 tablet (10 mg total) by mouth once as needed for migraine. May repeat in 2 hours if needed 30 tablet 0   ??? tiZANidine (ZANAFLEX) 2 MG tablet Take 1 tablet (2 mg total) by mouth every eight (8) hours as needed. 60 tablet 3   ??? topiramate (TOPAMAX) 100 MG tablet Take 1 tablet (100 mg total) by mouth Two (2) times a day. 180 tablet 3     No current facility-administered medications for this visit.         Changes to medications: Efraim reports no changes reported at this time.    No Known Allergies    Changes to allergies: No    SPECIALTY MEDICATION ADHERENCE     Emgality 120 mg/ml: 0 days of medicine on hand Specialty medication(s) dose(s) confirmed: Regimen is correct and unchanged.     Are there any concerns with adherence? Yes: When administering loading dose medication from one syringe squirted out and he was not able to get both pens.      Adherence counseling provided? Not needed, states his mother knows how to give injection now and was able to administer the 2nd pen    CLINICAL MANAGEMENT AND INTERVENTION      Clinical Benefit Assessment:    Do you feel the medicine is effective or helping your condition? He is still experiencing migraines and did not get full LD    Clinical Benefit counseling provided? consulted provider regarding clinical benefit concerns    Adverse Effects Assessment:    Are you experiencing any side effects? No    Are you experiencing difficulty administering your medicine? Yes, patient reports medication squirted out of one pen for loading dose. Medication administration counseling provided: When offered to counsel on administration, Merlin reported that his mother was able to administer the 2nd dose without issue and is comfortable now    Quality of Life Assessment:    How many days over the past month did your migraines  keep you from your normal activities? For example, brushing your  teeth or getting up in the morning. not sure but had to go to ER for one episode    Have you discussed this with your provider? Yes    Therapy Appropriateness:    Is therapy appropriate? Yes, therapy is appropriate and should be continued    DISEASE/MEDICATION-SPECIFIC INFORMATION      For patients on injectable medications: Patient currently has 0 doses left.  Next injection is scheduled for 03/07/19.    PATIENT SPECIFIC NEEDS     ? Does the patient have any physical, cognitive, or cultural barriers? No    ? Is the patient high risk? No     ? Does the patient require a Care Management Plan? No     ? Does the patient require physician intervention or other additional services (i.e. nutrition, smoking cessation, social work)? No      SHIPPING     Specialty Medication(s) to be Shipped:   Neurology: Emgality    Other medication(s) to be shipped: tizanidine, amlodipine (req. Refills), rizatriptan     Changes to insurance: No    Delivery Scheduled: Yes, Expected medication delivery date: 02/15/19.  However, Rx request for refills was sent to the provider as there are none remaining.     Medication will be delivered via Next Day Courier to the confirmed home address in [facility].    The patient will receive a drug information handout for each medication shipped and additional FDA Medication Guides as required.  Verified that patient has previously received a Conservation officer, historic buildings.    [name] [facility] Specialty Pharmacist

## 2019-02-14 MED FILL — EMGALITY PEN 120 MG/ML SUBCUTANEOUS PEN INJECTOR: 30 days supply | Qty: 1 | Fill #0 | Status: AC

## 2019-02-14 MED FILL — TIZANIDINE 2 MG TABLET: 20 days supply | Qty: 60 | Fill #0 | Status: AC

## 2019-02-14 MED FILL — AMLODIPINE 5 MG TABLET: 90 days supply | Qty: 90 | Fill #0 | Status: AC

## 2019-02-14 MED FILL — EMGALITY PEN 120 MG/ML SUBCUTANEOUS PEN INJECTOR: SUBCUTANEOUS | 30 days supply | Qty: 1 | Fill #0

## 2019-02-14 MED FILL — RIZATRIPTAN 10 MG DISINTEGRATING TABLET: 30 days supply | Qty: 18 | Fill #0 | Status: AC

## 2019-02-16 MED ORDER — GALCANEZUMAB-GNLM 120 MG/ML SUBCUTANEOUS PEN INJECTOR
SUBCUTANEOUS | 11 refills | 0 days | Status: CP
Start: 2019-02-16 — End: 2019-04-24

## 2019-03-06 NOTE — Unmapped (Signed)
[facility] Refill Coordination Note    Specialty Medication(s) to be Shipped:   General Specialty: Emgality 120mg /ml    Other medication(s) to be shipped: Tizantidine 2mg ,Rizatriptan10mg      Rafay Rosa [place], DOB: 1981/05/11  Phone: [phone] (home)       All above HIPAA information was verified with patient.     Completed refill call assessment today to schedule patient's medication shipment from the [facility] [phone]).       Specialty medication(s) and dose(s) confirmed: Regimen is correct and unchanged.   Changes to medications: Dujuan reports no changes at this time.  Changes to insurance: No  Questions for the pharmacist: No    Confirmed patient received Welcome Packet with first shipment. The patient will receive a drug information handout for each medication shipped and additional FDA Medication Guides as required.       DISEASE/MEDICATION-SPECIFIC INFORMATION        N/A    SPECIALTY MEDICATION ADHERENCE     Medication Adherence    Patient reported X missed doses in the last month:  0  Specialty Medication:  Emgality 120mg /ml                Emgality 120 mg/ml: 0 days of medicine on hand       SHIPPING     Shipping address confirmed in Epic.     Delivery Scheduled: Yes, Expected medication delivery date: 042320.     Medication will be delivered via Same Day Courier to the home address in [place].    [name]   [facility] Specialty Technician

## 2019-03-11 ENCOUNTER — Institutional Professional Consult (permissible substitution): Admit: 2019-03-11 | Discharge: 2019-03-12 | Payer: MEDICARE | Attending: Neurology | Primary: Neurology

## 2019-03-11 DIAGNOSIS — G43709 Chronic migraine without aura, not intractable, without status migrainosus: Principal | ICD-10-CM

## 2019-03-11 DIAGNOSIS — M542 Cervicalgia: Secondary | ICD-10-CM

## 2019-03-11 DIAGNOSIS — G8929 Other chronic pain: Secondary | ICD-10-CM

## 2019-03-11 MED ORDER — GALCANEZUMAB-GNLM 120 MG/ML SUBCUTANEOUS PEN INJECTOR
Freq: Once | SUBCUTANEOUS | 0 refills | 0 days | Status: CP
Start: 2019-03-11 — End: 2019-04-24
  Filled 2019-03-14: qty 2, 30d supply, fill #0

## 2019-03-11 MED ORDER — TIZANIDINE 2 MG TABLET
ORAL_TABLET | Freq: Three times a day (TID) | ORAL | 3 refills | 10 days | Status: CP | PRN
Start: 2019-03-11 — End: 2019-06-25
  Filled 2019-03-14: qty 60, 10d supply, fill #0

## 2019-03-11 NOTE — Unmapped (Signed)
1. Continue to track your headaches  2. Continue EMGALITY monthly- I have requested another LOADING DOSE - so next dose would be 2 INJECTIONS.   3. Continue Rizatriptan for your headache, as soon as you get it.   4. Tizanidine dose is increased- you may use this up to 3 times a day for migraine and neck pain.       Stay safe. Stay home.     Regards,    Dr. Hollie Beach  Board Certified: Adult Neurology  [facility] Division of General Medicine and Clinical Epidemiology  [place], [place]      Helpful Contact Information :    ?? If you have a cough, shortness of breath or you are concerned that you may have COVID-19, please call [phone] [facility] Helpline)    ?? Appointments/ Internal Medicine Clinic: [phone]    ?? HIPPA Compliant Facsimile: [phone]  ?? After Hours:  [phone].  A nurse will answer and can help you decide what kind of medication attention you need.       Thank you for choosing [facility] [employer].

## 2019-03-11 NOTE — Unmapped (Signed)
NEUROLOGY REPORT    [facility]  [facility] INTERNAL MEDICINE [place]  [address]  [place] [address]  [phone]    Date: March 11, 2019  Patient Name: Glenn Kennedy  MRN: [id number]  PCP:  Kurtis Bushman  Prior Visit: 01/18/2019; 12/29/17 (Botox) ; Nov 2017  NEUROLOGY REPORT    Time in with patient: 11:49 AM  Time out with patient: 12:03 PM        TELENEUROLOGY INFORMATION:  This visit is taking place in lieu of face-to-face visit, due to Covid-19 pandemic. This visit has taken place via telephone    I performed this clinical encounter by utilizing real time telehealth video connection between my location and the patients location.     The patients location was confirmed during the visit. Glenn Kennedy is currently located in their home: state of [place].    I am licensed in the [place] and I personally conducted this visit. I have identified myself to the patient and conveyed my credentials to him.    I have reviewed consent for this patient's visit, including the possibility of co-pay for this visit. I have explained the capabilities and limitations of telemedicine and Glenn Kennedy and I agree that it is appropriate for their current circumstances. The patient consents to this visit.     In case we get disconnected, patient's phone number is [phone] (home) .    Patient has signed informed consent on file in medical record.    I spent 14 minutes on the phone with the patient. I spent an additional 5 minutes on pre- and post-visit activities.     The patient was physically located in [place] or a state in which I am permitted to provide care. The patient understood that s/he may incur co-pays and cost sharing, and agreed to the telemedicine visit. The visit was completed via phone and/or video, which was appropriate and reasonable under the circumstances given the patient's presentation at the time.    The patient has been advised of the potential risks and limitations of this mode of treatment (including, but not limited to, the absence of in-person examination) and has agreed to be treated using telemedicine. The patient's/patient's family's questions regarding telemedicine have been answered.     If the phone/video visit was completed in an ambulatory setting, the patient has also been advised to contact their provider???s office for worsening conditions, and seek emergency medical treatment and/or call 911 if the patient deems either necessary.    Total Time spent  included: direct consultation reviewing details of history, examination and data findings, discussing my clinical reasoning and clinical impression, as well as a review of my recommendations for a plan of treatment as documented above. Additional time was provided to address alternative options for care, health literacy regarding the differential diagnosis, testing and medication options, the benefits of current plan as well as an opportunity to address all the patient's questions and concerns to their reported satisfaction. A written summary was provided to the patient at the time of the visit, as well as instructions on how to access My Chart. My contact information was provided along with instructions on how to reach the administrative office and the clinic.       Assessment and Plan/ Clinical Decision Making        1. Chronic migraine without aura without status migrainosus, not intractable    Glenn Kennedy is a 38 y.o. male  has a past medical history of Hypertension, Migraines, and Neck pain, chronic. mild intellectual disability, who lives with his very supportive mother,  presents for evaluation of migraine. Glenn Kennedy has continued to struggle with his migraines, since being back in [place]- he did really well on Emgality, with almost no headaches, but moved to [place] and did not receive it for 7-8 months while there. He would like to resume Emgality for migraine prevention.   He started [employer] again, which had been very helpful BUT got only 1 injection. He continues to have weekly migraines that respond to rizatriptan and sleep. Tizanidine helps a little with neck pain.     Plan of care includes:   1. Re-load with Emaglity  2. Apply for [employer]  3. Increase Tizanidine dose  4. Keep track of all headaches  5. Recheck efficacy in 6 weeks.     - galcanezumab-gnlm (EMGALITY) 120 mg/mL injection; Inject 240 mg under the skin once for 1 dose.  Dispense: 2 mL; Refill: 0  - tiZANidine (ZANAFLEX) 2 MG tablet; Take 2 tablets (4 mg total) by mouth every eight (8) hours as needed (Migraine/Neck pain).  Dispense: 60 tablet; Refill: 3    2. Neck pain, chronic  As above   Botox not an option due to pandemic. And, he has previously done well with emgality, so no need to change, simply adjust dosing.     HEALTH EDUCATION/HEALTH LITERACY: PATIENT EDUCATION was provided. Reviewed the differential diagnosis, plan of care in detail, as well as other elements as detailed above. The benefits and risks of each of the above procedures, benefits and alternative options were reviewed with the patient.     Medication Management: There is a high risk for medication side effects, which will require ongoing monitoring and dose adjustment. Medication side effect profile was reviewed in detail with patient. Medication safety and drug interactions reviewed on ERx.     Patient Counselling: All the patient's questions and concerns were addressed to their stated satisfaction .      Barriers to Care: COVID 19 Pandemic precludes face to face visits.     Return in about 6 weeks (around 04/22/2019) for Video Visit.              Subjective:          SOURCE: The history is obtained from the patient who appears to be a reliable historian. Additional history is obtained from review of available medical records. Open-ended and close-ended questions, as well as review of EPIC chart, including CareEverywhere if available.    History of Present Illness:     Glenn Kennedy is a 38 y.o.  male seen in followup for Headache Recurrent or Known Dx Migraines    TODAYS VISIT March 11, 2019  Continues to get periodic headaches out of nowhere- once a week, lasting about all day. Rizatriptan helps some of the time. Uses this about once a week. Does feel a little sleepy with this.   Received one injection- his mother, who administers medication.   EMGALITY approved 01/18/2019 through 04/23/2019- co pay is affordable.   Neck has been hurting- he feels that Tizanidine helps.   States that he ran out of his BP medication.       PRIOR VISIT 01/18/2019   Glenn Kennedy is a 38 y.o. right handed man with mild intellectual disability, who lives with his very supportive mother. Last seen over year ago, Mr. Stairs has continued to struggle with his migraines,  since being back in [place]- he did really well on Emgality, with almost no headaches, but moved to [place] and did not receive it for 7-8 months while there. He would like to resume Emgality for migraine prevention.     He has failed:   Beta-blocker 80 mg twice a day- not helping him at all.    Botox- some improvement, incomplete.   Galcanezumab- patient states marked improvement (no calendar today)    PLAN: Resume Emgality- will need to load again. His mother is able to administer medications.   Will need to keep HA calendar AND bring to the next visit.   Acute abortive Rx with Rizatriptan which works well for him.     - rizatriptan (MAXALT-MLT) 10 MG disintegrating tablet; Take 1 tablet (10 mg total) by mouth once as needed for migraine. May repeat in 2 hours if needed  Dispense: 30 tablet; Refill: 0  - tiZANidine (ZANAFLEX) 2 MG tablet; Take 1 tablet (2 mg total) by mouth every eight (8) hours as needed.  Dispense: 60 tablet; Refill: 3    Review of Systems:   A 10-systems review was performed.      Past Medical History:   Diagnosis Date   ??? Hypertension    ??? Migraines    ??? Neck pain, chronic        No past surgical history on file.    Social History Socioeconomic History   ??? Marital status: Single     Spouse name: Not on file   ??? Number of children: Not on file   ??? Years of education: Not on file   ??? Highest education level: Not on file   Occupational History   ??? Not on file   Social Needs   ??? Financial resource strain: Not very hard   ??? Food insecurity     Worry: Not on file     Inability: Not on file   ??? Transportation needs     Medical: Yes     Non-medical: Yes   Tobacco Use   ??? Smoking status: Current Every Day Smoker     Packs/day: 0.50     Years: 15.00     Pack years: 7.50     Types: Cigarettes     Start date: 06/01/1997   ??? Smokeless tobacco: Never Used   Substance and Sexual Activity   ??? Alcohol use: No     Alcohol/week: 0.0 standard drinks   ??? Drug use: No   ??? Sexual activity: Not on file   Lifestyle   ??? Physical activity     Days per week: Not on file     Minutes per session: Not on file   ??? Stress: Not on file   Relationships   ??? Social Wellsite geologist on phone: Not on file     Gets together: Not on file     Attends religious service: Not on file     Active member of club or organization: Not on file     Attends meetings of clubs or organizations: Not on file     Relationship status: Not on file   Other Topics Concern   ??? Not on file   Social History Narrative    12/24/18    Pt has moved back to Mom's house in [place].    Does not plan on going back to [place] anytime soon.       Social History     Tobacco Use  Smoking Status Current Every Day Smoker   ??? Packs/day: 0.50   ??? Years: 15.00   ??? Pack years: 7.50   ??? Types: Cigarettes   ??? Start date: 06/01/1997   Smokeless Tobacco Never Used         reports no history of alcohol use.      Family History   Problem Relation Age of Onset   ??? Stroke Mother 47        hemorrhagic   ??? Hyperlipidemia Mother    ??? Hypertension Mother    ??? Migraines Mother    ??? No Known Problems Father    ??? No Known Problems Brother    ??? Migraines Son    ??? No Known Problems Maternal Grandmother    ??? No Known Problems Maternal Grandfather        No Known Allergies    Current Outpatient Medications   Medication Sig Dispense Refill   ??? amLODIPine (NORVASC) 5 MG tablet Take 1 tablet (5 mg total) by mouth daily. 90 tablet 3   ??? diphenhydrAMINE (BENADRYL) 25 mg capsule Take 1 capsule (25 mg total) by mouth every four (4) hours as needed for itching (headache). 30 capsule 0   ??? galcanezumab-gnlm (EMGALITY) 120 mg/mL injection Inject the contents of 2 pens (240 mg) under the skin once for 1 dose. 2 mL 0   ??? galcanezumab-gnlm (EMGALITY) 120 mg/mL injection Inject the contents of 1 pen (120 mg) under the skin every thirty (30) days. 1 mL 11   ??? galcanezumab-gnlm (EMGALITY) 120 mg/mL injection Inject 240 mg under the skin once for 1 dose. 2 mL 0   ??? ibuprofen (MOTRIN) 600 MG tablet Take 1 tablet (600 mg total) by mouth every eight (8) hours as needed for pain (take as needed for Headache). 90 tablet 1   ??? propranolol (INDERAL) 80 MG tablet Take 1 tablet (80 mg total) by mouth Two (2) times a day. 180 tablet 3   ??? rizatriptan (MAXALT-MLT) 10 MG disintegrating tablet Take 1 tablet (10 mg total) by mouth once as needed for migraine. May repeat in 2 hours if needed 30 tablet 0   ??? tiZANidine (ZANAFLEX) 2 MG tablet Take 2 tablets (4 mg total) by mouth every eight (8) hours as needed (Migraine/Neck pain). 60 tablet 3   ??? topiramate (TOPAMAX) 100 MG tablet Take 1 tablet (100 mg total) by mouth Two (2) times a day. 180 tablet 3     No current facility-administered medications for this visit.           Objective:        GENERAL PHYSICAL EXAM:    Vital Signs: Not available for review due to Video Telehealth visit.   There were no vitals taken for this visit.  Estimated body mass index is 25.06 kg/m?? as calculated from the following:    Height as of 02/05/19: 170.2 cm (5' 7).    Weight as of 02/05/19: 72.6 kg (160 lb).  No height and weight on file for this encounter.      Wt Readings from Last 8 Encounters:   02/05/19 72.6 kg (160 lb)   01/18/19 88 kg (194 lb)   12/21/18 88.1 kg (194 lb 4.8 oz)   01/09/18 93.4 kg (206 lb)   12/13/17 94.8 kg (209 lb)   12/05/17 92.2 kg (203 lb 3.2 oz)   10/27/17 90.4 kg (199 lb 4.7 oz)   10/22/17 89.4 kg (197 lb)  General Comments: The patient  participated fully in the visit.       NEUROLOGICAL EXAMINATION   Mental Status  The patient is alert and oriented to time, place and person.   Attention/concentration adequate throughout the call   Speech and language were normal at the bedside.  Memory was intact for history, short and long-term.   Affect is appropriate   Mood does not appear depressed.    Insight/Judgement: Appropriate.   Thought process: Logical, linear, clear, coherent, goal directed.          Diagnostic Studies and Review of Records:     DATA   I have independently reviewed the studies as noted.   1. Medical Records: reviewed  2. Radiology report  Reviewed- CT brain 12/26/2013- unremarkable.         Dr. Hollie Beach  Board Certified: Adult Neurology  [place] Division of General Medicine and Clinical Epidemiology  [place], [place]      Thank you for choosing [facility].    Cc: Kurtis Bushman, MD

## 2019-03-14 MED FILL — RIZATRIPTAN 10 MG DISINTEGRATING TABLET: 20 days supply | Qty: 12 | Fill #1 | Status: AC

## 2019-03-14 MED FILL — TIZANIDINE 2 MG TABLET: 10 days supply | Qty: 60 | Fill #0 | Status: AC

## 2019-03-14 MED FILL — RIZATRIPTAN 10 MG DISINTEGRATING TABLET: ORAL | 20 days supply | Qty: 12 | Fill #1

## 2019-03-14 MED FILL — EMGALITY PEN 120 MG/ML SUBCUTANEOUS PEN INJECTOR: 30 days supply | Qty: 2 | Fill #0 | Status: AC

## 2019-03-15 NOTE — Unmapped (Signed)
Shipment for maintenance dose schedule for 03/14/19 was cancelled.  Per provider, Glenn Kennedy is to repeat LD of Emgality.  Pt received medication and administered LD on 03/14/19 without any issues.      [facility] Clinical Assessment & Refill Coordination Note    Glenn Kennedy, [place]: [birth year]  Phone: [phone] (home)     All above HIPAA information was verified with patient.     Specialty Medication(s):   Neurology: Emgality     Current Outpatient Medications   Medication Sig Dispense Refill   ??? amLODIPine (NORVASC) 5 MG tablet Take 1 tablet (5 mg total) by mouth daily. 90 tablet 3   ??? diphenhydrAMINE (BENADRYL) 25 mg capsule Take 1 capsule (25 mg total) by mouth every four (4) hours as needed for itching (headache). 30 capsule 0   ??? galcanezumab-gnlm (EMGALITY) 120 mg/mL injection Inject the contents of 2 pens (240 mg) under the skin once for 1 dose. 2 mL 0   ??? galcanezumab-gnlm (EMGALITY) 120 mg/mL injection Inject the contents of 1 pen (120 mg) under the skin every thirty (30) days. 1 mL 11   ??? galcanezumab-gnlm (EMGALITY) 120 mg/mL injection Inject the contents of 2 pens (240 mg) under the skin once for 1 dose. 2 mL 0   ??? ibuprofen (MOTRIN) 600 MG tablet Take 1 tablet (600 mg total) by mouth every eight (8) hours as needed for pain (take as needed for Headache). 90 tablet 1   ??? propranolol (INDERAL) 80 MG tablet Take 1 tablet (80 mg total) by mouth Two (2) times a day. 180 tablet 3   ??? rizatriptan (MAXALT-MLT) 10 MG disintegrating tablet Take 1 tablet (10 mg total) by mouth once as needed for migraine. May repeat in 2 hours if needed 30 tablet 0   ??? tiZANidine (ZANAFLEX) 2 MG tablet Take 2 tablets (4 mg total) by mouth every eight (8) hours as needed (Migraine/Neck pain). 60 tablet 3   ??? topiramate (TOPAMAX) 100 MG tablet Take 1 tablet (100 mg total) by mouth Two (2) times a day. 180 tablet 3     No current facility-administered medications for this visit.         Changes to medications: Glenn Kennedy reports no changes at this time.    No Known Allergies    Changes to allergies: No    SPECIALTY MEDICATION ADHERENCE     Emgality 240 mg: 0 days of medicine on hand     Specialty medication(s) dose(s) confirmed: Glenn Kennedy injected with repeat LD     Are there any concerns with adherence? No    Adherence counseling provided? Not needed    CLINICAL MANAGEMENT AND INTERVENTION      Clinical Benefit Assessment:    Do you feel the medicine is effective or helping your condition? Patient declined to answer    Clinical Benefit counseling provided? No long enough on therapy to ascertain.    Adverse Effects Assessment:    Are you experiencing any side effects? No    Are you experiencing difficulty administering your medicine? No    Quality of Life Assessment:    How many days over the past month did your migraines  keep you from your normal activities? For example, brushing your teeth or getting up in the morning. several    Have you discussed this with your provider? Yes    Therapy Appropriateness:    Is therapy appropriate? Yes, therapy is appropriate and should be continued    DISEASE/MEDICATION-SPECIFIC  INFORMATION      For patients on injectable medications: Patient currently has 0 doses left.  Next injection is scheduled for 04/12/19.    PATIENT SPECIFIC NEEDS     ? Does the patient have any physical, cognitive, or cultural barriers? No    ? Is the patient high risk? No     ? Does the patient require a Care Management Plan? No     ? Does the patient require physician intervention or other additional services (i.e. nutrition, smoking cessation, social work)? No      SHIPPING     Specialty Medication(s) to be Shipped:   Neurology: Emgality    Other medication(s) to be shipped: none     Changes to insurance: No    Delivery Scheduled: Medication shipped 03/14/19     Medication will be delivered via NA to the confirmed NA address in [facility].    The patient will receive a drug information handout for each medication shipped and additional FDA Medication Guides as required.  Verified that patient has previously received a Conservation officer, historic buildings.    Glenn Kennedy [facility] Specialty Pharmacist

## 2019-03-21 NOTE — Unmapped (Signed)
CCM Follow-up Call    PCP Action:None (video visit 03/27/19)    CCM Action: scheduled PCP appt next week.    Patient Action:answer phone 03/27/19       No c/o HA today  Denies need for refills.    Pt c/o a very light HA a couple of weeks ago, tizanidine 4mg  was effective.    Pt states will buy a BP cuff @ [facility] to follow up HTN.    Re CKD-Pt watches salt intake.     Pt preoccupied while speaking on the phone however denies any needs at this time.   Does agree to f/u PCP appt (has smart phone) next week.    Pt has CCM ph# for any concerns.    Pt has f/u Neurology appt in June.  Pt states emgality injections have been helpful. His mom gave him one, she did a good job    Pt with no concerns today.

## 2019-03-22 NOTE — Unmapped (Signed)
I was the supervising physician in the delivery of the service. Jacorion Klem D Saylor Murry, MD

## 2019-03-27 ENCOUNTER — Telehealth: Admit: 2019-03-27 | Discharge: 2019-03-28 | Payer: MEDICARE

## 2019-03-27 DIAGNOSIS — G43709 Chronic migraine without aura, not intractable, without status migrainosus: Secondary | ICD-10-CM

## 2019-03-27 DIAGNOSIS — I10 Essential (primary) hypertension: Principal | ICD-10-CM

## 2019-03-27 NOTE — Unmapped (Signed)
..  Previsit complete    Time spent on phone:     Reason for visit: Follow up    Questions / Concerns that need to be addressed:    General Consent to Treat (GCT) for non-epic video visits only: Verbal consent    Vitals signs, if any:       Diabetes:  ? Regularly checking blood sugars?: no  o If yes, when? Complete log  Date Before Breakfast After Breakfast Before Lunch After Lunch Before Dinner After Dinner Before Bed                                                                                                                                 Hypertension:  ? Have blood pressure cuff at home?: no  ? Regularly checking blood pressure?: no  If yes, complete log  Date Time BP Pulse                                                                           Weight:    Date          Allergies reviewed: Yes    Medication reviewed: Yes    Refills needed, if any: None    Travel Screening: completed    Falls Risk: not completed    Hark (DV) Screening: completed    HCDM reviewed and updated in Epic:  Completed         HARK Screening  HARK Screening  Within the last year, have you been humiliated or emotionally abused in other ways by your partner or ex-partner?: No  Within the last year, have you been afraid of your partner or ex-partner?: No  Within the last year, have you been raped or forced to have any kind of sexual activity by your partner or ex-partner?: No  Within the last year, have you been kicked, hit, slapped, or otherwise physically hurt by your partner or ex-partner?: No    AUDIT       PHQ2       PHQ9          GAD7       COPD Assessment

## 2019-03-27 NOTE — Unmapped (Signed)
Internal Medicine Video Visit    This visit is conducted via video conferencing.    Contact Information  Person Contacted: Patient  Contact Phone number: [phone] (home)   Is there someone else in the room? No.   Patient agreed to a video visit    Glenn Kennedy is a 38 y.o. male  participating in a video visit.    Reason for visit:  HTN  Headaches    Subjective:  emgality shots restarted.  No headaches. Does sots in the middle of the month. Has already received next Injector in the mail. Mother helps him with injections    No issues with BP meds. Picked up 60-month supply at the end of March      I have reviewed the problem list, medications, and allergies and have updated/reconciled them if needed.    Objective:  General:  Well-appearing, lying in bed. NAD      Assessment & Plan:  1. Essential hypertension    2. Chronic migraine without aura without status migrainosus, not intractable        HTN  Will try to get home BP cuff   -- continue amlodpine 5mg  daily    Chronic migraines w/o aura  Well controlled with Emgality  Reinforced adherence with monthly injections    Return in about 6 months (around 09/27/2019).       I spent 12 minutes on the audio/video with the patient. I spent an additional 2 minutes on pre- and post-visit activities.     The patient was physically located in [place] or a state in which I am permitted to provide care. The patient and/or parent/gauardian understood that s/he may incur co-pays and cost sharing, and agreed to the telemedicine visit. The visit was completed via phone and/or video, which was appropriate and reasonable under the circumstances given the patient's presentation at the time.    The patient and/or parent/guardian has been advised of the potential risks and limitations of this mode of treatment (including, but not limited to, the absence of in-person examination) and has agreed to be treated using telemedicine. The patient's/patient's family's questions regarding telemedicine have been answered.     If the phone/video visit was completed in an ambulatory setting, the patient and/or parent/guardian has also been advised to contact their provider???s office for worsening conditions, and seek emergency medical treatment and/or call 911 if the patient deems either necessary.

## 2019-04-08 MED ORDER — RIZATRIPTAN 10 MG DISINTEGRATING TABLET
ORAL_TABLET | Freq: Once | ORAL | 1 refills | 50 days | Status: CP | PRN
Start: 2019-04-08 — End: 2019-07-18
  Filled 2019-04-11: qty 18, 30d supply, fill #0

## 2019-04-08 NOTE — Unmapped (Signed)
[facility] Refill Coordination Note    Specialty Medication(s) to be Shipped:   General Specialty: Emgality 120mg /ml    Other medication(s) to be shipped:  Rizatriptan-(faxed dr)Tizantidine 2mg      Glenn Kennedy, DOB: [birth year]  Phone: There are no phone numbers on file.      All above HIPAA information was verified with patient.     Completed refill call assessment today to schedule patient's medication shipment from the [facility] [phone]).       Specialty medication(s) and dose(s) confirmed: Regimen is correct and unchanged.   Changes to medications: Glenn Kennedy reports no changes at this time.  Changes to insurance: No  Questions for the pharmacist: No    Confirmed patient received Welcome Packet with first shipment. The patient will receive a drug information handout for each medication shipped and additional FDA Medication Guides as required.       DISEASE/MEDICATION-SPECIFIC INFORMATION        N/A    SPECIALTY MEDICATION ADHERENCE     Medication Adherence    Patient reported X missed doses in the last month:  0  Specialty Medication:  emgality 120mg /ml                Emgality 120 mg/ml: 0 days of medicine on hand       SHIPPING     Shipping address confirmed in Epic.     Delivery Scheduled: Yes, Expected medication delivery date: 05/21.     Medication will be delivered via Same Day Courier to the home address in Epic WAM.    [name]   [facility] Specialty Technician

## 2019-04-11 MED FILL — TIZANIDINE 2 MG TABLET: ORAL | 10 days supply | Qty: 60 | Fill #1

## 2019-04-11 MED FILL — EMGALITY PEN 120 MG/ML SUBCUTANEOUS PEN INJECTOR: SUBCUTANEOUS | 30 days supply | Qty: 1 | Fill #1

## 2019-04-11 MED FILL — EMGALITY PEN 120 MG/ML SUBCUTANEOUS PEN INJECTOR: 30 days supply | Qty: 1 | Fill #1 | Status: AC

## 2019-04-11 MED FILL — RIZATRIPTAN 10 MG DISINTEGRATING TABLET: 30 days supply | Qty: 18 | Fill #0 | Status: AC

## 2019-04-11 MED FILL — TIZANIDINE 2 MG TABLET: 10 days supply | Qty: 60 | Fill #1 | Status: AC

## 2019-04-22 NOTE — Unmapped (Deleted)
NEUROLOGY REPORT    [facility]  [facility] INTERNAL MEDICINE [place]  [address] [place] [address]  [phone]    Date: April 22, 2019  Patient Name: Glenn Kennedy  MRN: [id number]  PCP:  Kurtis Bushman  Time in with patient: 10:15 AM  Time out with patient: ***  Prior visit with this Neurologist: 03/11/2019     TELENEUROLOGY INFORMATION:  This visit is taking place in lieu of face-to-face visit, due to Covid-19 pandemic. This visit has taken place via telephone    I performed this clinical encounter by utilizing real time telehealth video connection between my location and the patients location.     I have identified myself to the patient and conveyed my credentials to him.    I have reviewed consent for this patient's visit, including the possibility of co-pay for this visit. I have explained the capabilities and limitations of telemedicine and [name] and I agree that it is appropriate for their current circumstances. The patient consents to this visit.     In case we get disconnected, patient's phone number is There are no phone numbers on file..    Patient has signed informed consent on file in medical record.    Is there someone else in the room? No.    I spent *** minutes on the phone with the patient. I spent an additional *** minutes on pre- and post-visit activities.     The patient was physically located in [place] or a state in which I am permitted to provide care. The patient and/or parent/gauardian understood that s/he may incur co-pays and cost sharing, and agreed to the telemedicine visit. The visit was completed via phone and/or video, which was appropriate and reasonable under the circumstances given the patient's presentation at the time.    The patient and/or parent/guardian has been advised of the potential risks and limitations of this mode of treatment (including, but not limited to, the absence of in-person examination) and has agreed to be treated using telemedicine. The patient's/patient's family's questions regarding telemedicine have been answered.     If the phone/video visit was completed in an ambulatory setting, the patient and/or parent/guardian has also been advised to contact their provider???s office for worsening conditions, and seek emergency medical treatment and/or call 911 if the patient deems either necessary.    Total Time spent  included: direct consultation reviewing details of history, examination and data findings, discussing my clinical reasoning and clinical impression, as well as a review of my recommendations for a plan of treatment as documented above. Additional time was provided to address alternative options for care, health literacy regarding the differential diagnosis, testing and medication options, the benefits of current plan as well as an opportunity to address all the patient's questions and concerns to their reported satisfaction. A written summary was provided to the patient at the time of the visit, as well as instructions on how to access My Chart. My contact information was provided along with instructions on how to reach the administrative office and the clinic.       Assessment and Plan/ Clinical Decision Making        Good morning Glenn Kennedy this is Dr. Rivka Safer with [facility] we was scheduled to meet today at 10 AM I apologize I am running a little behind I did send a request for the visit at 1015 and since you had not responded I followed up with  a phone call if you are still available please let us know I be happy to  Glenn Kennedy is a 38 y.o. male  has a past medical history of Hypertension, Migraines, and Neck pain, chronic. who presents for evaluation of ***. His neurological examination ***. The constellation of history and exam suggest the diagnosis of ***.    Plan of care includes:         HEALTH EDUCATION/HEALTH LITERACY: PATIENT EDUCATION was provided. Reviewed the differential diagnosis, plan of care in detail, as well as other elements as detailed above. The benefits and risks of each of the above procedures, benefits and alternative options were reviewed with the patient.     Medication Management: There is a high risk for medication side effects, which will require ongoing monitoring and dose adjustment. Medication side effect profile was reviewed in detail with patient. Medication safety and drug interactions reviewed on ERx.     Patient Counselling: All the patient's questions and concerns were addressed to their stated satisfaction .      Barriers to Care: COVID 19 Pandemic precludes face to face visits.     No follow-ups on file.              Subjective:          SOURCE: The history is obtained from the patient who appears to be a reliable historian. Additional history is obtained from review of available medical records. Open-ended and close-ended questions, as well as review of EPIC chart, including CareEverywhere if available.    History of Present Illness:     Glenn Kennedy is a 38 y.o. {NEU Hand Dominance:[id number]} male seen in followup for No chief complaint on file.  Marland Kitchen      PRIOR VISIT 03/11/2019 See notes for details of that visit. I had requested reloading w/Emgality at that time.  EMGALITY approved 01/18/2019 through 04/23/2019    TODAYS VISIT April 22, 2019    Focused on :   Emgality injections- no side effects  Headaches:     Review of Systems: A 10-systems review was performed. Unless otherwise identified here, in HPI or by patient  no additional symptoms identified.       Past Medical History:   Diagnosis Date   ??? Hypertension    ??? Migraines    ??? Neck pain, chronic      No past surgical history on file.  Family History   Problem Relation Age of Onset   ??? Stroke Mother 73        hemorrhagic   ??? Hyperlipidemia Mother    ??? Hypertension Mother    ??? Migraines Mother    ??? No Known Problems Father    ??? No Known Problems Brother    ??? Migraines Son    ??? No Known Problems Maternal Grandmother    ??? No Known Problems Maternal Grandfather        No Known Allergies  Current Outpatient Medications   Medication Sig Dispense Refill   ??? amLODIPine (NORVASC) 5 MG tablet Take 1 tablet (5 mg total) by mouth daily. 90 tablet 3   ??? diphenhydrAMINE (BENADRYL) 25 mg capsule Take 1 capsule (25 mg total) by mouth every four (4) hours as needed for itching (headache). 30 capsule 0   ??? galcanezumab-gnlm (EMGALITY) 120 mg/mL injection Inject the contents of 2 pens (240 mg) under the skin once for 1 dose. 2 mL 0   ??? galcanezumab-gnlm (EMGALITY) 120 mg/mL injection Inject  the contents of 1 pen (120 mg) under the skin every thirty (30) days. 1 mL 11   ??? galcanezumab-gnlm (EMGALITY) 120 mg/mL injection Inject the contents of 2 pens (240 mg) under the skin once for 1 dose. 2 mL 0   ??? ibuprofen (MOTRIN) 600 MG tablet Take 1 tablet (600 mg total) by mouth every eight (8) hours as needed for pain (take as needed for Headache). 90 tablet 1   ??? propranolol (INDERAL) 80 MG tablet Take 1 tablet (80 mg total) by mouth Two (2) times a day. 180 tablet 3   ??? rizatriptan (MAXALT-MLT) 10 MG disintegrating tablet Dissolve 1 tablet (10 mg total) on the tongue once as needed for migraine. May repeat in 2 hours if needed 30 tablet 1   ??? topiramate (TOPAMAX) 100 MG tablet Take 1 tablet (100 mg total) by mouth Two (2) times a day. 180 tablet 3     No current facility-administered medications for this visit.        Social History     Socioeconomic History   ??? Marital status: Single     Spouse name: Not on file   ??? Number of children: Not on file   ??? Years of education: Not on file   ??? Highest education level: Not on file   Occupational History   ??? Not on file   Social Needs   ??? Financial resource strain: Not very hard   ??? Food insecurity     Worry: Not on file     Inability: Not on file   ??? Transportation needs     Medical: Yes     Non-medical: Yes   Tobacco Use   ??? Smoking status: Current Every Day Smoker     Packs/day: 0.50     Years: 15.00     Pack years: 7.50     Types: Cigarettes     Start date: 06/01/1997   ??? Smokeless tobacco: Never Used   Substance and Sexual Activity   ??? Alcohol use: No     Alcohol/week: 0.0 standard drinks   ??? Drug use: No   ??? Sexual activity: Not on file   Lifestyle   ??? Physical activity     Days per week: Not on file     Minutes per session: Not on file   ??? Stress: Not on file   Relationships   ??? Social Wellsite geologist on phone: Not on file     Gets together: Not on file     Attends religious service: Not on file     Active member of club or organization: Not on file     Attends meetings of clubs or organizations: Not on file     Relationship status: Not on file   Other Topics Concern   ??? Not on file   Social History Narrative    12/24/18    Pt has moved back to Mom's house in .    Does not plan on going back to [place] anytime soon.     Social History     Tobacco Use   Smoking Status Current Every Day Smoker   ??? Packs/day: 0.50   ??? Years: 15.00   ??? Pack years: 7.50   ??? Types: Cigarettes   ??? Start date: 06/01/1997   Smokeless Tobacco Never Used       reports no history of alcohol use.       Objective:  GENERAL PHYSICAL EXAM:    Vital Signs: Not available for review due to Video Telehealth visit.   There were no vitals taken for this visit.  Estimated body mass index is 25.06 kg/m?? as calculated from the following:    Height as of 02/05/19: 170.2 cm (5' 7).    Weight as of 02/05/19: 72.6 kg (160 lb).  No height and weight on file for this encounter.      Wt Readings from Last 8 Encounters:   02/05/19 72.6 kg (160 lb)   01/18/19 88 kg (194 lb)   12/21/18 88.1 kg (194 lb 4.8 oz)   01/09/18 93.4 kg (206 lb)   12/13/17 94.8 kg (209 lb)   12/05/17 92.2 kg (203 lb 3.2 oz)   10/27/17 90.4 kg (199 lb 4.7 oz)   10/22/17 89.4 kg (197 lb)        General Comments: The patient was well-groomed and neat, engaged and cooperative. They participated fully in the visit.     ENT:  No abnormalities visible during the visit.  Neck: Full range of motion at the cervical spine. No limitations or significant discomfort.     NEUROLOGICAL EXAMINATION   Mental Status  The patient is alert and oriented to time, place and person.   Attention/concentration adequate throughout the examination.   Speech and language were normal at the bedside.  Memory was intact for history, short and long-term.   Affect is appropriate   Mood does not appear depressed.    Insight/Judgement: Appropriate.   Thought process: Logical, linear, clear, coherent, goal directed.     Cranial nerves  I:  Smell is intact by history only.   II:  Pupils equal, round and reactive to light.  Visual fields cannot be accurately evaluated.    III, IV, VI:  Extraocular eye movements are normal and full with no nystagmus.    VII:  Facial symmetry preserved.    VIII:  Hearing is intact.     XI/XII:  Shoulder shrug is symmetrical and tongue is midline.     Motor / Musculoskeletal   Abnormal Movements: No tremor.   Strength:  No evidence of pronator drift.  Fine finger movements are normal. Able to stand from seated position, squat and rise without help.   Cerebellar/Coordination:  No evidence of dysdiadochokinesis or ataxia on finger to nose testing. Rapid alternating movements are normal.    Gait: Normal gait and stance.   Romberg: is {present/absent:52582}         Diagnostic Studies and Review of Records:     DATA   I have independently reviewed the studies as noted.   1. Medical Records: reviewed  2. Radiology report and images reviewed  No results found.  3. Labs: reviewed  Lab Results   Component Value Date    WBC 9.0 12/07/2017    HGB 13.7 12/07/2017    HCT 40.5 (L) 12/07/2017    PLT 210 12/07/2017    ALT 40 12/05/2017    AST 30 12/05/2017    NA 143 12/20/2017    K 3.6 12/20/2017    CL 112 (H) 12/07/2017    CREATININE 1.60 (H) 12/20/2017    BUN 18 12/07/2017    CO2 21.0 (L) 12/07/2017    TSH 1.066 09/25/2017     No results found for: LDL  Lab Results   Component Value Date    A1C 5.5 12/05/2017     Lab Results   Component Value Date  WBC 9.0 12/07/2017    RBC 4.46 (L) 12/07/2017    HGB 13.7 12/07/2017    HCT 40.5 (L) 12/07/2017    MCV 90.8 12/07/2017    MCH 30.7 12/07/2017    MCHC 33.8 12/07/2017    RDW 13.4 12/07/2017    PLT 210 12/07/2017       Dr. Hollie Beach  CC: Kurtis Bushman, MD  Dictated using voice-activated software. Despite editing, typographical errors may exist.

## 2019-04-22 NOTE — Unmapped (Signed)
Mr. Casella did not answer my request for Video call. I left a voicemail with him after first attempt. Second attempt- no answer.     His Emgality PA expires 04/23/2019- I need to discuss how he is doing prior to requesting ongoing medication. Please reschedule his visit for a mutually convenient time.     Have also sent a MyChart message        Dr. Hollie Beach  Board Certified: Adult Neurology  [facility] Division of General Medicine and Clinical Epidemiology  [place], [place]

## 2019-04-24 ENCOUNTER — Telehealth: Admit: 2019-04-24 | Discharge: 2019-04-25 | Payer: MEDICARE | Attending: Neurology | Primary: Neurology

## 2019-04-24 DIAGNOSIS — G43709 Chronic migraine without aura, not intractable, without status migrainosus: Principal | ICD-10-CM

## 2019-04-24 DIAGNOSIS — M542 Cervicalgia: Secondary | ICD-10-CM

## 2019-04-24 MED ORDER — GALCANEZUMAB-GNLM 120 MG/ML SUBCUTANEOUS PEN INJECTOR
SUBCUTANEOUS | 11 refills | 30 days | Status: CP
Start: 2019-04-24 — End: 2020-04-23
  Filled 2019-05-10: qty 1, 30d supply, fill #0

## 2019-04-24 NOTE — Unmapped (Signed)
I have FINALLY reached Glenn Kennedy! I remember that name--it took forever to reach him to get that last appt scheduled! I stressed the importance of this appt, told him he wouldn't be able to get Emgality again unless he paid for it, until he had an appt with you. If it's working for him he should definitely keep this appt!

## 2019-04-24 NOTE — Unmapped (Signed)
NEUROLOGY REPORT    [facility]  [facility] INTERNAL MEDICINE [place]  [address] [place] [address]  [phone]    Date: April 24, 2019  Patient Name: Glenn Kennedy  MRN: [id number]  PCP:  Kurtis Bushman  Time in with patient: 4:01 PM  Time out with patient: 4:18 PM    Prior visit with this Neurologist: 03/11/2019     TELENEUROLOGY INFORMATION:  This visit is taking place in lieu of face-to-face visit, due to Covid-19 pandemic. This visit has taken place via telephone    I performed this clinical encounter by utilizing real time telehealth video connection between my location and the patients location.     I have identified myself to the patient and conveyed my credentials to him.    I have reviewed consent for this patient's visit, including the possibility of co-pay for this visit. I have explained the capabilities and limitations of telemedicine and Mr. Dendinger and I agree that it is appropriate for their current circumstances. The patient consents to this visit.     In case we get disconnected, patient's phone number is [phone] (home) .    Patient has signed informed consent on file in medical record.    Is there someone else in the room? No.    I spent 17 minutes on the phone with the patient. I spent an additional 5 minutes on pre- and post-visit activities.     The patient was physically located in [place] or a state in which I am permitted to provide care. The patient and/or parent/gauardian understood that s/he may incur co-pays and cost sharing, and agreed to the telemedicine visit. The visit was completed via phone and/or video, which was appropriate and reasonable under the circumstances given the patient's presentation at the time.    The patient and/or parent/guardian has been advised of the potential risks and limitations of this mode of treatment (including, but not limited to, the absence of in-person examination) and has agreed to be treated using telemedicine. The patient's/patient's family's questions regarding telemedicine have been answered.     If the phone/video visit was completed in an ambulatory setting, the patient and/or parent/guardian has also been advised to contact their provider???s office for worsening conditions, and seek emergency medical treatment and/or call 911 if the patient deems either necessary.    Total Time spent  included: direct consultation reviewing details of history, examination and data findings, discussing my clinical reasoning and clinical impression, as well as a review of my recommendations for a plan of treatment as documented above. Additional time was provided to address alternative options for care, health literacy regarding the differential diagnosis, testing and medication options, the benefits of current plan as well as an opportunity to address all the patient's questions and concerns to their reported satisfaction. A written summary was provided to the patient at the time of the visit, as well as instructions on how to access My Chart. My contact information was provided along with instructions on how to reach the administrative office and the clinic.       Assessment and Plan/ Clinical Decision Making            [name] is a 38 y.o. male  has a past medical history of Hypertension, Migraines, and Neck pain, chronic. who presents for evaluation :    1. Chronic migraine without aura without status migrainosus, not intractable  Currently on prophylaxis with topiramate and galcanezumab  with excellent results. Headaches only once a week, easy to treat and no longer impact his QOL.     2. Cervicalgia  Ongoing- will try PT.   Did not improve with tizanidine but unclear on adherence.     HEALTH EDUCATION/HEALTH LITERACY: PATIENT EDUCATION was provided. Reviewed the differential diagnosis, plan of care in detail, as well as other elements as detailed above. The benefits and risks of each of the above procedures, benefits and alternative options were reviewed with the patient.     Medication Management: There is a high risk for medication side effects, which will require ongoing monitoring and dose adjustment. Medication side effect profile was reviewed in detail with patient. Medication safety and drug interactions reviewed on ERx.     Patient Counselling: All the patient's questions and concerns were addressed to their stated satisfaction .      Barriers to Care: COVID 19 Pandemic precludes face to face visits.     Return in about 2 months (around 07/01/2019) for Video Visit. Check on results of PT for cervicalgia, check HA calendar, consider DC Topiramate.               Subjective:          SOURCE: The history is obtained from the patient who appears to be a reliable historian. Additional history is obtained from review of available medical records. Open-ended and close-ended questions, as well as review of EPIC chart, including CareEverywhere if available.    History of Present Illness:     Glenn Kennedy is a 38 y.o.  male seen in followup for Headache      PRIOR VISIT we resumed Emgality, since he had done really well on Emgality, with almost no headaches, until he moved to [place] and did not receive it for 7-8 months while there. EMGALITY approved 01/18/2019 through 04/23/2019.    TODAYS VISIT April 24, 2019    Headache:     Emgality: getting injections - has had 3 injections. Last injection about a week ago. No side effects.   Headaches every now and then- no more than weekly, typically 7/10 lasting   Triggers include the heat.   Also on Topiramate 100 MG BID- no sure of adherence.    Calendar- not sure how he is keeping it.     Neck Pain:     Continues to bother him. He feels that nothing helps his neck pain.   Does not recall PT.     Review of Systems: A 10-systems review was performed. Unless otherwise identified here, in HPI or by patient  no additional symptoms identified.              Past Medical History:   Diagnosis Date   ??? Hypertension    ??? Migraines    ??? Neck pain, chronic      No past surgical history on file.  Family History   Problem Relation Age of Onset   ??? Stroke Mother 45        hemorrhagic   ??? Hyperlipidemia Mother    ??? Hypertension Mother    ??? Migraines Mother    ??? No Known Problems Father    ??? No Known Problems Brother    ??? Migraines Son    ??? No Known Problems Maternal Grandmother    ??? No Known Problems Maternal Grandfather        No Known Allergies  Current Outpatient Medications   Medication Sig Dispense Refill   ???  amLODIPine (NORVASC) 5 MG tablet Take 1 tablet (5 mg total) by mouth daily. 90 tablet 3   ??? diphenhydrAMINE (BENADRYL) 25 mg capsule Take 1 capsule (25 mg total) by mouth every four (4) hours as needed for itching (headache). 30 capsule 0   ??? galcanezumab-gnlm (EMGALITY) 120 mg/mL injection Inject the contents of 1 pen (120 mg) under the skin every thirty (30) days. 1 mL 11   ??? ibuprofen (MOTRIN) 600 MG tablet Take 1 tablet (600 mg total) by mouth every eight (8) hours as needed for pain (take as needed for Headache). 90 tablet 1   ??? propranolol (INDERAL) 80 MG tablet Take 1 tablet (80 mg total) by mouth Two (2) times a day. 180 tablet 3   ??? rizatriptan (MAXALT-MLT) 10 MG disintegrating tablet Dissolve 1 tablet (10 mg total) on the tongue once as needed for migraine. May repeat in 2 hours if needed 30 tablet 1   ??? topiramate (TOPAMAX) 100 MG tablet Take 1 tablet (100 mg total) by mouth Two (2) times a day. 180 tablet 3     No current facility-administered medications for this visit.        Social History     Socioeconomic History   ??? Marital status: Single     Spouse name: Not on file   ??? Number of children: Not on file   ??? Years of education: Not on file   ??? Highest education level: Not on file   Occupational History   ??? Not on file   Social Needs   ??? Financial resource strain: Not very hard   ??? Food insecurity     Worry: Not on file     Inability: Not on file   ??? Transportation needs     Medical: Yes Non-medical: Yes   Tobacco Use   ??? Smoking status: Current Every Day Smoker     Packs/day: 0.50     Years: 15.00     Pack years: 7.50     Types: Cigarettes     Start date: 06/01/1997   ??? Smokeless tobacco: Never Used   Substance and Sexual Activity   ??? Alcohol use: No     Alcohol/week: 0.0 standard drinks   ??? Drug use: No   ??? Sexual activity: Not on file   Lifestyle   ??? Physical activity     Days per week: Not on file     Minutes per session: Not on file   ??? Stress: Not on file   Relationships   ??? Social Wellsite geologist on phone: Not on file     Gets together: Not on file     Attends religious service: Not on file     Active member of club or organization: Not on file     Attends meetings of clubs or organizations: Not on file     Relationship status: Not on file   Other Topics Concern   ??? Not on file   Social History Narrative    12/24/18    Pt has moved back to Mom's house in [place].    Does not plan on going back to [place] anytime soon.     Social History     Tobacco Use   Smoking Status Current Every Day Smoker   ??? Packs/day: 0.50   ??? Years: 15.00   ??? Pack years: 7.50   ??? Types: Cigarettes   ??? Start date: 06/01/1997   Smokeless Tobacco Never Used  reports no history of alcohol use.       Objective:        GENERAL PHYSICAL EXAM:    Vital Signs: Not available for review due to Video Telehealth visit.   There were no vitals taken for this visit.  Estimated body mass index is 25.06 kg/m?? as calculated from the following:    Height as of 02/05/19: 170.2 cm (5' 7).    Weight as of 02/05/19: 72.6 kg (160 lb).  No height and weight on file for this encounter.      Wt Readings from Last 8 Encounters:   02/05/19 72.6 kg (160 lb)   01/18/19 88 kg (194 lb)   12/21/18 88.1 kg (194 lb 4.8 oz)   01/09/18 93.4 kg (206 lb)   12/13/17 94.8 kg (209 lb)   12/05/17 92.2 kg (203 lb 3.2 oz)   10/27/17 90.4 kg (199 lb 4.7 oz)   10/22/17 89.4 kg (197 lb)        General Comments: The patient participated fully in the visit. NEUROLOGICAL EXAMINATION   Mental Status  The patient is alert and oriented to time, place and person.   Attention/concentration adequate throughout the examination.   Speech and language were normal at the bedside.         Diagnostic Studies and Review of Records:     DATA   I have independently reviewed the studies as noted.   1. Medical Records: reviewed    Dr. Hollie Beach  CC: Kurtis Bushman, MD  Dictated using voice-activated software. Despite editing, typographical errors may exist.

## 2019-04-24 NOTE — Unmapped (Signed)
1. Please track all your headaches. You may use an APP like Migraine Buddy or simply write them down: include date/time of headache, severity (1= mild, 2= moderate, 3=-severe unable to function) and whether there was anything that helped or triggered the headache. BRING THIS WITH YOU TO EVERY VISIT.   2. Continue Emgality injections every month. Check [email or web address] if you need help with how to administer the injections, or call the office.   3. For your neck pain: PT has been ordered.     For coronavirus updates, please check: https://huff.com/    Stay safe. Stay home.     Regards,    Dr. Hollie Beach  Board Certified: Adult Neurology  [facility] Division of General Medicine and Clinical Epidemiology  [place], [place]      Helpful Contact Information :    ?? If you have a cough, shortness of breath or you are concerned that you may have COVID-19, please call [phone] [facility] Helpline)    ?? Appointments/ Internal Medicine Clinic: [phone]    ?? HIPPA Compliant Facsimile: [phone]  ?? After Hours:  [phone].  A nurse will answer and can help you decide what kind of medication attention you need.       Thank you for choosing [employer].

## 2019-04-25 NOTE — Unmapped (Signed)
I called to schedule a follow up Return in about 2 months (around 07/01/2019) for Video Visit. -mg

## 2019-04-26 ENCOUNTER — Ambulatory Visit
Admit: 2019-04-26 | Discharge: 2019-05-25 | Payer: MEDICARE | Attending: Rehabilitative and Restorative Service Providers" | Primary: Rehabilitative and Restorative Service Providers"

## 2019-04-26 DIAGNOSIS — G8929 Other chronic pain: Secondary | ICD-10-CM

## 2019-04-26 DIAGNOSIS — G43709 Chronic migraine without aura, not intractable, without status migrainosus: Secondary | ICD-10-CM

## 2019-04-26 DIAGNOSIS — M542 Cervicalgia: Secondary | ICD-10-CM
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
## 2019-05-02 DIAGNOSIS — G8929 Other chronic pain: Secondary | ICD-10-CM

## 2019-05-02 DIAGNOSIS — M542 Cervicalgia: Secondary | ICD-10-CM

## 2019-05-02 DIAGNOSIS — G43709 Chronic migraine without aura, not intractable, without status migrainosus: Secondary | ICD-10-CM

## 2019-05-06 NOTE — Unmapped (Signed)
[facility] [place]  OUTPATIENT PHYSICAL THERAPY  05/02/2019  Note Type: Treatment Note       Patient Name: Glenn Kennedy  Date of Birth:[birth year]   Visit #: 1  Diagnosis:   Encounter Diagnoses   Name Primary?   ??? Neck pain, chronic Yes   ??? Cervicalgia    ??? Chronic migraine without aura without status migrainosus, not intractable      Referring MD:  Laural Roes Gon*     Plan of Care Effective Date: 04/26/2019 - 8/5/20206/5/20 to 06/26/19      Assessment & Plan     Assessment details: Continued hypomobility of cervical spine with facet mobilizations, increased tenderness and reduced mobility on left compared to right. Tenderness extends to quadrangular space in back of shoulder. Weakness in cervical deep neck flexors with reduced scapular control which can be possible contributing factor to headaches. He needs additional skilled PT in order to improve scapular and cervical strength to reduce pain and restore function      Impairments: decreased endurance, pain, decreased strength, decreased range of motion, core weakness, impaired flexibility, impaired motor control, joint restriction, poor awareness of body mechanics, postural weakness and impaired tone        Personal Factors/Comorbidities: 3+  Specific Comorbidities: chronicity of condition, mild cognitive ipmairment, HTN, CKD, tobacco use   Examination of Body Systems: 4+ elements  Body System: MSK, neuro, integ, functional limitations, participation restrictions    Clinical Presentation: stable  Clinical Decision Making: moderate    Prognosis: fair      Positive Prognosis Rationale: age, motivated for treatment, caregiver/family support, response to trial tx and previous tx with benefit.   Negative Prognosis Rationale: insight, financial status, medical status/condition, chronicity of condition, severity of symptoms, body habitus, endurance and strength.  Barriers to therapy: financial         Therapy Goals  Goals: In 2 mos:   1. The patient will demonstrate independent performance of HEP for symptom management and to maintain functional gains.   2. B cervical rotation AROM at least 65 deg to improve ability to look over shoulders while driving.   3. Pt able to perform overhead press with at least 10#/10# B demonstrating good posture and mechanics for return to weight lifting to promote MSK health.      Plan  Therapy options: will be seen for skilled physical therapy services    Planned therapy interventions: education - patient, endurance activites, functional mobility, gait training, home exercise program, therapeutic exercises, therapeutic activities, manual therapy, neuromuscular re-education, postural training, body mechanics training, taping and TENs    Other planned therapy interventions: TrP DN       Frequency: 1x week    Duration in weeks: up to 2 mos    Education provided to: patient.  Education provided: anatomy, importance of Therapy, role of therapy in Rehabilitation, treatment options and plan, posture, HEP, body mechanics, indications/contraindications to exercises, body awareness and symptom management (DN post-care)  Education Results: needs further instruction.  Communication/Consultation: Initial note sent to Referring Provider and Medicare Cert/POC sent to Referring Provider.                Subjective     History of Present Illness  Date of Onset: 04/25/2014    Date of Evaluation: 04/26/2019    Reason for Referral/Chief Complaint: Chronic neck pain and migraines       Subjective: Patient reporting a reduction in headaches over the last week and denies  having any migraines    Quality of life: fair          Pain      Current pain rating: 2      At best pain rating: 0      At worst pain rating: 6      Location: UT region, usually L, occasionally R       Quality: tight, tightness, pulling and aching      Relieving factors: medications (injections)      Aggravating factors: sleeping (shrugging shoulders, neck rotation)      Pain Related Behaviors: none    Progression: no change      Red flags: none    Prior Functional Status: No physical limitations.        Current functional status: disturbed sleep and limited recreation      Precautions and Equipment  Precautions: None  Current Braces/Orthoses: None  Equipment Currently Used: None    Social Support  Lives in: [place] house  Lives with: mother and young children  Hand dominance: right  Communication Preference: verbal, written and visual   Barriers to Learning: No Barriers  Work/School: on disability, previously enjoyed playing basketball and weight lifting     Diagnostic Tests                              Diagnostic Test Comments: none recently     Treatments      Previous treatment: injection treatment and medication                      Patient Goals  Patient goals for therapy: decreased pain, increased strength, increased ROM, improved sleep, return to recreational activites and return to sport/leisure activities      Patient goal: return to basketball and weight lifting          Objective      Resting BP: 134/92    Posture/Observations:   Sitting: forward head, rounded shoulders, moderate thoracic kyphosis --> no change in pain with correction  Standing: forward head, rounded shoulders, moderate thoracic kyphosis    Range of Motion   0-24%   range available 25-49%   range available 50-74% range available  75+%   range available AROM PROM Comments   Flexion    x   Pulling/stretching on back   Extension    x   No change   R Rotation   x    Increased L sided pain   L Rotation    x   No change   R Side bend   x    Increased L sided pain   L Side bend    x   Increased L sided pain   Protraction          Retraction            Repeated Motions: NT    Shoulder AROM Screen: pain B with HBH and HBB positions on corresponding sides     UE Sensation- Light Touch (Dermatomes): unremarkable    UE Strength (Myotomes)  MMT Right Left Comments   Shoulder Shrug (C4) 5/5 5/5 Pain on L    Shoulder Abduction (C5) 4/5 4/5 Pain on L   Shoulder Flexion 4/5 4/5 Pain on L    Shoulder ER @ side 4/5 4/5    Shoulder IR @ side 4/5 4/5    Elbow Flexion (C6)  Extension Extension (C7)      Wrist Extension (C6)      Wrist Flexion (C7)      Thumb Extension (C8)      Finger Adduction (T1)        Reflexes: not indicated     Special Tests   Right Left Comments   Alar Ligament      Sharp-Purser            Spurling's A  -    Distraction  -    ULTT-A        Palpation/Segmental Motion/Joint Play:  - TTP: significant with hypertonicity & myofascial TrPs @ L UT, levator scap  - CPA: non-specific pain may be more associated w/ pressure rather than PA @ c/spine, gross t/spine hypomobility  - UPA:                        Total Time: 44 min     Treatment Rendered:      Therapeutic Exercise: 14  min   Prone A I min x 3 with 5 lbs  Supine Chin Tuck with pull apart 1 min x 3  Thoracic rotation Pallof in tall kneesling    Manual Therapy: 24 min   - Assessment of myofascial TrPs pre-/post-DN   - STM L UT post-DN   - Grade II-III scapular mobilizations   Grade 3-4 cervical extension UPA and CPA to cervical spine     Intramuscular Therapy: 5 minutes  Glenn Kennedy  was informed of the risks associated with dry needling and he signed consent form.  Patient screened for any contraindications to needling including pregnancy, infection, antibiotic use, anticoagulants use, autoimmune issues, uncontrolled diabetes, and active cancer/treatment and does not have any of these conditions.  DRY NEEDLING FOR RELEASING IDENTIFIED TRIGGER POINTS TO FACILITATE MANUAL THERAPY TECHNIQUES AND ALLOW MUSCLE TO RETURN TO OPTIMAL LENGTH (NO CHARGE).  Upper trap, cervical paraspinals, quadrangular space of shoulder trigger point with 30mm x .30mm with Pinch/Pull Technique established for safety -            Current HEP  Cervical rotation & SB stretches throughout the day      I attest that I have reviewed the above information.  Signed: Lanice Shirts Suad Autrey, PT  05/02/2019 8:30 AM

## 2019-05-06 NOTE — Unmapped (Signed)
[facility] Refill Coordination Note    Specialty Medication(s) to be Shipped:   General Specialty: Emgality 120mg /ml    Other medication(s) to be shipped: Amlodipine, Maxalt, and Zanaflex     Glenn Kennedy, DOB: 03/28/1981  Phone: [phone] (home)       All above HIPAA information was verified with patient.     Completed refill call assessment today to schedule patient's medication shipment from the [facility] [phone]).       Specialty medication(s) and dose(s) confirmed: Regimen is correct and unchanged.   Changes to medications: Omid reports no changes at this time.  Changes to insurance: No  Questions for the pharmacist: No    Confirmed patient received Welcome Packet with first shipment. The patient will receive a drug information handout for each medication shipped and additional FDA Medication Guides as required.       DISEASE/MEDICATION-SPECIFIC INFORMATION        N/A    SPECIALTY MEDICATION ADHERENCE     Medication Adherence    Patient reported X missed doses in the last month:  0  Specialty Medication:  Emgality 120mg /ml  Patient is on additional specialty medications:  No  Patient is on more than two specialty medications:  No  Any gaps in refill history greater than 2 weeks in the last 3 months:  no  Demonstrates understanding of importance of adherence:  yes  Informant:  patient  Confirmed plan for next specialty medication refill:  delivery by pharmacy          Refill Coordination    Has the Patients' Contact Information Changed:  No  Is the Shipping Address Different:  No           Emgality 120mg /ml  : 0 days of medicine on hand       SHIPPING     Shipping address confirmed in Epic.     Delivery Scheduled: Yes, Expected medication delivery date: 05/10/2019.     Medication will be delivered via Same Day Courier to the home address in [employer] [place].    Jorje Guild   [facility] Specialty Technician

## 2019-05-09 DIAGNOSIS — M542 Cervicalgia: Secondary | ICD-10-CM

## 2019-05-09 DIAGNOSIS — G8929 Other chronic pain: Secondary | ICD-10-CM

## 2019-05-09 DIAGNOSIS — G43709 Chronic migraine without aura, not intractable, without status migrainosus: Secondary | ICD-10-CM

## 2019-05-09 NOTE — Unmapped (Signed)
[facility] ALLIED HEALTH PT [facility]  OUTPATIENT PHYSICAL THERAPY  05/09/2019          Patient Name: Glenn Kennedy  Date of Birth:1980-12-30   Visit #: 1  Diagnosis:   Encounter Diagnoses   Name Primary?   ??? Neck pain, chronic Yes   ??? Cervicalgia    ??? Chronic migraine without aura without status migrainosus, not intractable      Referring MD:  Laural Roes Gon*     Plan of Care Effective Date: 04/26/2019 - 8/5/20206/5/20 to 06/26/19      Assessment & Plan     Assessment details: Today Glenn Kennedy reported to PT with painful C-spine ROM, expressive UT shrug with taxing E functional mobility, and palpable tightness of L c sp paraspinals. Interventions focused on active C-spine ROM paired with functional mobility, lower trapezius strengthening and activation for promotion of appropriate scapulohumeral rhythm, and manual therapy to release L C-spine paraspinals tightness. Glenn Kennedy demonstrated improved scapulohumeral rhythm with overhead functional mobility at the end of the PT session. Future PT sessions will continue emphasis on cervical ROM with functional mobility. Glenn Kennedy would continue to benefit from skilled physical therapy to maintain momentum toward the patient's goals.        Impairments: decreased endurance, pain, decreased strength, decreased range of motion, core weakness, impaired flexibility, impaired motor control, joint restriction, poor awareness of body mechanics, postural weakness and impaired tone        Personal Factors/Comorbidities: 3+  Specific Comorbidities: chronicity of condition, mild cognitive ipmairment, HTN, CKD, tobacco use     Body System: MSK, neuro, integ, functional limitations, participation restrictions    Clinical Presentation: stable  Clinical Decision Making: moderate    Prognosis: fair      Positive Prognosis Rationale: age, motivated for treatment, caregiver/family support, response to trial tx and previous tx with benefit.   Negative Prognosis Rationale: insight, financial status, medical status/condition, chronicity of condition, severity of symptoms, body habitus, endurance and strength.  Barriers to therapy: financial         Therapy Goals  Goals: In 2 mos:   1. The patient will demonstrate independent performance of HEP for symptom management and to maintain functional gains.   2. B cervical rotation AROM at least 65 deg to improve ability to look over shoulders while driving.   3. Pt able to perform overhead press with at least 10#/10# B demonstrating good posture and mechanics for return to weight lifting to promote MSK health.      Plan  Therapy options: will be seen for skilled physical therapy services    Planned therapy interventions: education - patient, endurance activites, functional mobility, gait training, home exercise program, therapeutic exercises, therapeutic activities, manual therapy, neuromuscular re-education, postural training, body mechanics training, taping and TENs    Other planned therapy interventions: TrP DN       Frequency: 1x week    Duration in weeks: up to 2 mos    Education provided to: patient.  Education provided: anatomy, importance of Therapy, role of therapy in Rehabilitation, treatment options and plan, posture, HEP, body mechanics, indications/contraindications to exercises, body awareness and symptom management (DN post-care)  Education Results: needs further instruction.  Communication/Consultation: Initial note sent to Referring Provider and Medicare Cert/POC sent to Referring Provider.  Next visit plan: Scapular and cervical mobility with focus on cervical range of motion   Total Session Time: 45  Treatment rendered today: Manual therapy (16) -  - PT facilitated overpressure into L sidebend/L  rotation, R sidebend/R rotation  - STM to L cervical paraspinals  - suboccipital release bil   - mobilization into opening of R cervical facets paired with PROM of pt into R cervical sidebending  - contract/relax of L rotation L sidebend + STM to posterior scalene    Neuromuscular re-ed (24) -  - Paloff press doubled blue band 2 x 12 each side; 1st set with fixed stare of cspine forward, 2nd set with contralateral rotation of c spine  - prone Ys 2 x 12 with 5# weights  - DB windmills 7.5# 2 x 8           Subjective     History of Present Illness  Date of Onset: 04/25/2014    Date of Evaluation: 04/26/2019    Reason for Referral/Chief Complaint: Chronic neck pain and migraines       Subjective: Pt reported his headaches are down to once a week but sometimes last >/= 2 days.    Quality of life: fair          Pain      Current pain rating: 2      At best pain rating: 0      At worst pain rating: 6      Location: UT region, usually L, occasionally R       Quality: tight, tightness, pulling and aching      Relieving factors: medications (injections)      Aggravating factors: sleeping (shrugging shoulders, neck rotation)      Pain Related Behaviors: none    Progression: no change      Red flags: none    Prior Functional Status: No physical limitations.        Current functional status: disturbed sleep and limited recreation      Precautions and Equipment  Precautions: None  Current Braces/Orthoses: None  Equipment Currently Used: None    Social Support  Lives in: [place]  Lives with: mother and young children  Hand dominance: right  Communication Preference: verbal, written and visual   Barriers to Learning: No Barriers  Work/School: on disability, previously enjoyed playing basketball and weight lifting     Diagnostic Tests                              Diagnostic Test Comments: none recently     Treatments      Previous treatment: injection treatment and medication                      Patient Goals  Patient goals for therapy: decreased pain, increased strength, increased ROM, improved sleep, return to recreational activites and return to sport/leisure activities      Patient goal: return to basketball and weight lifting          Objective      Resting BP: 134/92    Posture/Observations:   Sitting: forward head, rounded shoulders, moderate thoracic kyphosis --> no change in pain with correction  Standing: forward head, rounded shoulders, moderate thoracic kyphosis    Range of Motion   0-24%   range available 25-49%   range available 50-74% range available  75+%   range available AROM PROM Comments   Flexion    x   Pulling/stretching on back   Extension    x   No change   R Rotation   x    Increased L sided pain  L Rotation    x   No change   R Side bend   x    Increased L sided pain   L Side bend    x   Increased L sided pain   Protraction          Retraction            Repeated Motions: NT    Shoulder AROM Screen: pain B with HBH and HBB positions on corresponding sides     UE Sensation- Light Touch (Dermatomes): unremarkable    UE Strength (Myotomes)  MMT Right Left Comments   Shoulder Shrug (C4) 5/5 5/5 Pain on L    Shoulder Abduction (C5) 4/5 4/5 Pain on L   Shoulder Flexion 4/5 4/5 Pain on L    Shoulder ER @ side 4/5 4/5    Shoulder IR @ side 4/5 4/5    Elbow Flexion (C6)      Extension Extension (C7)      Wrist Extension (C6)      Wrist Flexion (C7)      Thumb Extension (C8)      Finger Adduction (T1)        Reflexes: not indicated     Special Tests   Right Left Comments   Alar Ligament      Sharp-Purser            Spurling's A  -    Distraction  -    ULTT-A        Palpation/Segmental Motion/Joint Play:  - TTP: significant with hypertonicity & myofascial TrPs @ L UT, levator scap  - CPA: non-specific pain may be more associated w/ pressure rather than PA @ c/spine, gross t/spine hypomobility  - UPA:                                     Intramuscular Therapy: 5 minutes  Dquan Ronnald Nian Kennedy  was informed of the risks associated with dry needling and he signed consent form.  Patient screened for any contraindications to needling including pregnancy, infection, antibiotic use, anticoagulants use, autoimmune issues, uncontrolled diabetes, and active cancer/treatment and does not have any of these conditions.  DRY NEEDLING FOR RELEASING IDENTIFIED TRIGGER POINTS TO FACILITATE MANUAL THERAPY TECHNIQUES AND ALLOW MUSCLE TO RETURN TO OPTIMAL LENGTH (NO CHARGE).  Upper trap, cervical paraspinals, quadrangular space of shoulder trigger point with 30mm x .30mm with Pinch/Pull Technique established for safety -            Current HEP  Cervical rotation & SB stretches throughout the day      I attest that I have reviewed the above information.  Signed: Stephenie Acres, SPT  05/09/2019 8:03 AM     I was physically present and immediately available to direct and supervise tasks that were related to patient management. The direction and supervision was continuous throughout the time these tasks were performed.     Rosaria Ferries, PT, DPT

## 2019-05-10 MED FILL — TIZANIDINE 2 MG TABLET: 10 days supply | Qty: 60 | Fill #2 | Status: AC

## 2019-05-10 MED FILL — AMLODIPINE 5 MG TABLET: 90 days supply | Qty: 90 | Fill #1 | Status: AC

## 2019-05-10 MED FILL — EMGALITY PEN 120 MG/ML SUBCUTANEOUS PEN INJECTOR: 30 days supply | Qty: 1 | Fill #0 | Status: AC

## 2019-05-10 MED FILL — RIZATRIPTAN 10 MG DISINTEGRATING TABLET: ORAL | 30 days supply | Qty: 18 | Fill #1

## 2019-05-10 MED FILL — AMLODIPINE 5 MG TABLET: ORAL | 90 days supply | Qty: 90 | Fill #1

## 2019-05-10 MED FILL — TIZANIDINE 2 MG TABLET: ORAL | 10 days supply | Qty: 60 | Fill #2

## 2019-05-10 MED FILL — RIZATRIPTAN 10 MG DISINTEGRATING TABLET: 30 days supply | Qty: 18 | Fill #1 | Status: AC

## 2019-05-16 DIAGNOSIS — M542 Cervicalgia: Secondary | ICD-10-CM

## 2019-05-16 DIAGNOSIS — G43709 Chronic migraine without aura, not intractable, without status migrainosus: Secondary | ICD-10-CM

## 2019-05-16 DIAGNOSIS — G8929 Other chronic pain: Secondary | ICD-10-CM

## 2019-05-20 NOTE — Unmapped (Signed)
[place] ALLIED HEALTH PT [place]  OUTPATIENT PHYSICAL THERAPY  05/16/2019  Note Type: Treatment Note       Patient Name: Glenn Kennedy  Date of Birth:02/14/1981   Visit #: 1  Diagnosis:   Encounter Diagnoses   Name Primary?   ??? Neck pain, chronic Yes   ??? Cervicalgia    ??? Chronic migraine without aura without status migrainosus, not intractable      Referring MD:  Mercie Eon*     Plan of Care Effective Date: 04/26/2019 - 8/5/20206/5/20 to 06/26/19      Assessment & Plan     Assessment details: Patient is progressing with cervical flexion and extension isometrics. Initiated closed chain strengthening to improve stabilize at cervical spine and scapula. Notable instability of left shoulder compared to right and increased with full loading at shoulder. Patient needs to improve co activation and stabilization through scapula to minimize cervicogenic headache referral. Continued skilled PT warranted to progress posterior chain strenght and overall function.       Impairments: decreased endurance, pain, decreased strength, decreased range of motion, core weakness, impaired flexibility, impaired motor control, joint restriction, poor awareness of body mechanics, postural weakness and impaired tone        Personal Factors/Comorbidities: 3+  Specific Comorbidities: chronicity of condition, mild cognitive ipmairment, HTN, CKD, tobacco use     Body System: MSK, neuro, integ, functional limitations, participation restrictions    Clinical Presentation: stable  Clinical Decision Making: moderate    Prognosis: fair      Positive Prognosis Rationale: age, motivated for treatment, caregiver/family support, response to trial tx and previous tx with benefit.   Negative Prognosis Rationale: insight, financial status, medical status/condition, chronicity of condition, severity of symptoms, body habitus, endurance and strength.  Barriers to therapy: financial         Therapy Goals  Goals: In 2 mos:   1. The patient will demonstrate independent performance of HEP for symptom management and to maintain functional gains.   2. B cervical rotation AROM at least 65 deg to improve ability to look over shoulders while driving.   3. Pt able to perform overhead press with at least 10#/10# B demonstrating good posture and mechanics for return to weight lifting to promote MSK health.      Plan  Therapy options: will be seen for skilled physical therapy services    Planned therapy interventions: education - patient, endurance activites, functional mobility, gait training, home exercise program, therapeutic exercises, therapeutic activities, manual therapy, neuromuscular re-education, postural training, body mechanics training, taping and TENs    Other planned therapy interventions: TrP DN       Frequency: 1x week    Duration in weeks: up to 2 mos    Education provided to: patient.  Education provided: anatomy, importance of Therapy, role of therapy in Rehabilitation, treatment options and plan, posture, HEP, body mechanics, indications/contraindications to exercises, body awareness and symptom management (DN post-care)  Education Results: needs further instruction.  Communication/Consultation: Initial note sent to Referring Provider and Medicare Cert/POC sent to Referring Provider.  Next visit plan: Scapular and cervical mobility with focus on cervical range of motion   Total Session Time: 45  Treatment rendered today: Manual therapy (16) -  - PT facilitated overpressure into L sidebend/L rotation, R sidebend/R rotation  - STM to L cervical paraspinals  - suboccipital release bil   - mobilization into opening of R cervical facets paired with PROM of pt into R cervical sidebending  -  contract/relax of L rotation L sidebend + STM to posterior scalene    Tes- 24 minutes    Cervical flexion and extension off end of table x 30  Plank walks with sliders 10 feet x 2  Plank reaches x 20 each side          Subjective     History of Present Illness  Date of Onset: 04/25/2014    Date of Evaluation: 04/26/2019    Reason for Referral/Chief Complaint: Chronic neck pain and migraines       Subjective: Continues to report reduction in frequency of headaches, has not had any over the last week     Quality of life: fair          Pain      Current pain rating: 2      At best pain rating: 0      At worst pain rating: 6      Location: UT region, usually L, occasionally R       Quality: tight, tightness, pulling and aching      Relieving factors: medications (injections)      Aggravating factors: sleeping (shrugging shoulders, neck rotation)      Pain Related Behaviors: none    Progression: no change      Red flags: none    Prior Functional Status: No physical limitations.        Current functional status: disturbed sleep and limited recreation      Precautions and Equipment  Precautions: None  Current Braces/Orthoses: None  Equipment Currently Used: None    Social Support  Lives in: [place] house  Lives with: mother and young children  Hand dominance: right  Communication Preference: verbal, written and visual   Barriers to Learning: No Barriers  Work/School: on disability, previously enjoyed playing basketball and weight lifting     Diagnostic Tests                              Diagnostic Test Comments: none recently     Treatments      Previous treatment: injection treatment and medication                      Patient Goals  Patient goals for therapy: decreased pain, increased strength, increased ROM, improved sleep, return to recreational activites and return to sport/leisure activities      Patient goal: return to basketball and weight lifting          Objective      Resting BP: 134/92    Posture/Observations:   Sitting: forward head, rounded shoulders, moderate thoracic kyphosis --> no change in pain with correction  Standing: forward head, rounded shoulders, moderate thoracic kyphosis    Range of Motion   0-24%   range available 25-49%   range available 50-74% range available  75+%   range available AROM PROM Comments   Flexion    x   Pulling/stretching on back   Extension    x   No change   R Rotation   x    Increased L sided pain   L Rotation    x   No change   R Side bend   x    Increased L sided pain   L Side bend    x   Increased L sided pain   Protraction          Retraction  Repeated Motions: NT    Shoulder AROM Screen: pain B with HBH and HBB positions on corresponding sides     UE Sensation- Light Touch (Dermatomes): unremarkable    UE Strength (Myotomes)  MMT Right Left Comments   Shoulder Shrug (C4) 5/5 5/5 Pain on L    Shoulder Abduction (C5) 4/5 4/5 Pain on L   Shoulder Flexion 4/5 4/5 Pain on L    Shoulder ER @ side 4/5 4/5    Shoulder IR @ side 4/5 4/5    Elbow Flexion (C6)      Extension Extension (C7)      Wrist Extension (C6)      Wrist Flexion (C7)      Thumb Extension (C8)      Finger Adduction (T1)        Reflexes: not indicated     Special Tests   Right Left Comments   Alar Ligament      Sharp-Purser            Spurling's A  -    Distraction  -    ULTT-A        Palpation/Segmental Motion/Joint Play:  - TTP: significant with hypertonicity & myofascial TrPs @ L UT, levator scap  - CPA: non-specific pain may be more associated w/ pressure rather than PA @ c/spine, gross t/spine hypomobility  - UPA:                                               Current HEP  Cervical rotation & SB stretches throughout the day      I attest that I have reviewed the above information.  Signed: Rosaria Ferries, PT, SPT  05/16/2019 3:30 PM

## 2019-05-28 NOTE — Unmapped (Signed)
[facility] Pharm[facility] Refill Coordination Note    Specialty Medication(s) to be Shipped:   General Specialty: Emgality 120mg /ml     Other medication(s) to be shipped: Rizatriptan 10mg ,Tizantidine 2mg      Glenn Kennedy, DOB: 06-17-1981  Phone: [phone] (home)       All above HIPAA information was verified with patient.     Completed refill call assessment today to schedule patient's medication shipment from the [facility] [phone]).       Specialty medication(s) and dose(s) confirmed: Regimen is correct and unchanged.   Changes to medications: Kylie reports no changes at this time.  Changes to insurance: No  Questions for the pharmacist: No    Confirmed patient received Welcome Packet with first shipment. The patient will receive a drug information handout for each medication shipped and additional FDA Medication Guides as required.       DISEASE/MEDICATION-SPECIFIC INFORMATION        N/A    SPECIALTY MEDICATION ADHERENCE     Medication Adherence    Patient reported X missed doses in the last month:  0  Specialty Medication:  Emgality 120mg /ml  Patient is on additional specialty medications:  No                Emgality 120 mg/ml: 0 days of medicine on hand       SHIPPING     Shipping address confirmed in Epic.     Delivery Scheduled: Yes, Expected medication delivery date: 07/15.     Medication will be delivered via Same Day Courier to the home address in Epic WAM.    [name]   [facility] Specialty Technician

## 2019-06-05 MED FILL — EMGALITY PEN 120 MG/ML SUBCUTANEOUS PEN INJECTOR: SUBCUTANEOUS | 30 days supply | Qty: 1 | Fill #1

## 2019-06-05 MED FILL — TIZANIDINE 2 MG TABLET: ORAL | 10 days supply | Qty: 60 | Fill #3

## 2019-06-05 MED FILL — EMGALITY PEN 120 MG/ML SUBCUTANEOUS PEN INJECTOR: 30 days supply | Qty: 1 | Fill #1 | Status: AC

## 2019-06-05 MED FILL — RIZATRIPTAN 10 MG DISINTEGRATING TABLET: ORAL | 30 days supply | Qty: 18 | Fill #2

## 2019-06-05 MED FILL — TIZANIDINE 2 MG TABLET: 10 days supply | Qty: 60 | Fill #3 | Status: AC

## 2019-06-05 MED FILL — RIZATRIPTAN 10 MG DISINTEGRATING TABLET: 30 days supply | Qty: 18 | Fill #2 | Status: AC

## 2019-06-25 MED ORDER — TIZANIDINE 2 MG TABLET
ORAL_TABLET | Freq: Three times a day (TID) | ORAL | 3 refills | 10 days | Status: CP | PRN
Start: 2019-06-25 — End: 2019-07-28
  Filled 2019-07-02: qty 60, 10d supply, fill #0

## 2019-06-25 NOTE — Unmapped (Signed)
[facility] Special[facility] Pharm[facility] Refill Coordination Note    Specialty Medication(s) to be Shipped:   General Specialty: emgality 120mg /ml    Other medication(s) to be shipped: Rizatriptan,Tizantidine faxed dr     Glenn Kennedy, DOB: 28-Feb-1981  Phone: There are no phone numbers on file.      All above HIPAA information was verified with patient.     Completed refill call assessment today to schedule patient's medication shipment from the [facility] [phone]).       Specialty medication(s) and dose(s) confirmed: Regimen is correct and unchanged.   Changes to medications: [name] reports no changes at this time.  Changes to insurance: No  Questions for the pharmacist: No    Confirmed patient received Welcome Packet with first shipment. The patient will receive a drug information handout for each medication shipped and additional FDA Medication Guides as required.       DISEASE/MEDICATION-SPECIFIC INFORMATION        N/A    SPECIALTY MEDICATION ADHERENCE     Medication Adherence    Patient reported X missed doses in the last month: 0  Specialty Medication: emgality 120mg /ml  Patient is on additional specialty medications: No                emgality 120 mg/ml: 0 days of medicine on hand       SHIPPING     Shipping address confirmed in Epic.     Delivery Scheduled: Yes, Expected medication delivery date: 08/11.     Medication will be delivered via Same Day Courier to the home address in Epic WAM.    Glenn Kennedy   [facility] Specialty Technician

## 2019-07-02 ENCOUNTER — Telehealth: Admit: 2019-07-02 | Discharge: 2019-07-03 | Payer: MEDICARE

## 2019-07-02 DIAGNOSIS — G43709 Chronic migraine without aura, not intractable, without status migrainosus: Secondary | ICD-10-CM

## 2019-07-02 DIAGNOSIS — I10 Essential (primary) hypertension: Principal | ICD-10-CM

## 2019-07-02 MED ORDER — TOPIRAMATE 100 MG TABLET
ORAL_TABLET | Freq: Two times a day (BID) | ORAL | 3 refills | 90 days | Status: CP
Start: 2019-07-02 — End: 2020-07-01

## 2019-07-02 MED FILL — RIZATRIPTAN 10 MG DISINTEGRATING TABLET: ORAL | 10 days supply | Qty: 6 | Fill #3

## 2019-07-02 MED FILL — EMGALITY PEN 120 MG/ML SUBCUTANEOUS PEN INJECTOR: 30 days supply | Qty: 1 | Fill #2 | Status: AC

## 2019-07-02 MED FILL — EMGALITY PEN 120 MG/ML SUBCUTANEOUS PEN INJECTOR: SUBCUTANEOUS | 30 days supply | Qty: 1 | Fill #2

## 2019-07-02 MED FILL — TIZANIDINE 2 MG TABLET: 10 days supply | Qty: 60 | Fill #0 | Status: AC

## 2019-07-02 MED FILL — RIZATRIPTAN 10 MG DISINTEGRATING TABLET: 10 days supply | Qty: 6 | Fill #3 | Status: AC

## 2019-07-02 NOTE — Unmapped (Signed)
..  Previsit complete    Time spent on phone:     Reason for visit: Follow up    General Consent to Treat (GCT) for non-epic video visits only: Verbal consent    Weight: 165 lbs    Date   07/02/19       Allergies reviewed: Yes    Medication reviewed: Yes    Refills needed, if any: NONE    Travel Screening: completed    Falls Risk: not completed    Hark (DV) Screening: not completed    HCDM reviewed and updated in Epic:  Completed

## 2019-07-03 NOTE — Unmapped (Signed)
[facility]  Established Patient - Phone Visit    This visit is conducted via telephone.    Contact Information  Person Contacted: Patient  Contact Phone number: [phone] (home)   Is there someone else in the room? No.   Patient agreed to a phone visit    Mr. Naumann is a 38 y.o. male  participating in a telephone encounter.    Reason for Call  Follow up for HTN, headaches, neck pain    Subjective:  The patient is a 38yo man with a PMHx of HTN, migraines, and chronic neck pain who presents for follow-up of these issues.    The patient reports that his headaches have significantly improved on Emgality. He has been taking Emgality once per month and plans to do his next injection today. He has noticed that heat can trigger his headaches, so he avoids being outside too long on hot days.     The patient is doing regular PT for his chronic neck pain. He feels that the PT is helping. States that his next appt in on the 20th.     The patient does not check his blood pressure at home. He is taking amlodipine 5mg  once per day. Denies lightheadedness, dizziness, chest pain, or shortness of breath.     He is smoking around 1 pack of cigarettes per day and states that he is not interested in stopping at this time.     I have reviewed the problem list, medications, and allergies and have updated/reconciled them if needed.    Objective:  As part of this Telephone Visit, no in-person exam was conducted.    The patient was appropriately conversant on the phone.    Assessment & Plan:  1. Essential hypertension    2. Chronic migraine without aura without status migrainosus, not intractable        1. Essential Hypertension  Last labs were checked in Jan 2019. We will put in orders to check current kidney function. The patient does not have a home blood pressure monitor. He states that he does have Medicaid and therefore may be eligible for a home monitor. Last BP was measured in March 2020. We will schedule a follow-up visit in person to measure BP.  - Measure Cr, Na, K - will get at [facility] hospital. Will ask Toniann Fail, CCM RN to ensure that this is completed.  - Follow-up visit in person for measurement of BP given his CKD  - goal BP < 120/90    2. Chronic migraine  Significant improvement on emgality and topiramate. Heat seems to be a trigger for headaches.  - Refill topiramate     Return in about 4 weeks (around 07/30/2019).         I spent 15 minutes on the phone with the patient. I spent an additional 4 minutes on pre- and post-visit activities.     The patient was physically located in [place] or a state in which I am permitted to provide care. The patient and/or parent/guardian understood that s/he may incur co-pays and cost sharing, and agreed to the telemedicine visit. The visit was reasonable and appropriate under the circumstances given the patient's presentation at the time.    The patient and/or parent/guardian has been advised of the potential risks and limitations of this mode of treatment (including, but not limited to, the absence of in-person examination) and has agreed to be treated using telemedicine. The patient's/patient's family's questions regarding telemedicine have been  answered.     If the visit was completed in an ambulatory setting, the patient and/or parent/guardian has also been advised to contact their provider???s office for worsening conditions, and seek emergency medical treatment and/or call 911 if the patient deems either necessary.            Renee Rival, MS-3    I have verified all student documentation or findings.  I have personally performed or re-performed the physical exam and medical decision making.

## 2019-07-10 NOTE — Unmapped (Signed)
CCM Follow-up Call      PCP Action:None     CCM Action:f/u labs 07/15/19 (Monday). sending CKD educational pamphlet to pt.     Patient Action:labs Monday and buying BP cuff @ [facility] Monday will call CCM for any concerns.       Follow up/Discuss next call: CKD, HA, HTN  (receive CKD pamphlet in mail?)      Remind Patients Parking is Free!  ----------------------------------------------------------------    Any new concerns or symptoms since you were seen last in clinic/telephone call?  No. Still haven't done lab work and have not bought BP cuff, will do both next Monday after helps son w/ online classwork.      Review last clinic note and medical plan with patient. Did patient follow plan?yes and is following up  --If no why? Was there a barrier?health literacy    Any recent hospitalization / ED / Urgent Care or Providers seen outside the [facility] system? no      Are you taking medications as prescribed by provider?Yes.  Review medication list(note any discrepancies, changes or alterations in way medications are taken from providers directions).  Any questions about medications?No.  Any refills needed?No.      Review upcoming clinic appointments  Done       Offered [facility], Urgent Care and [facility] information/Education?  Yes.    Pt states has not had a HA for 2 weeks. States heat can trigger it.  When gets HA takes maxalt, rizatriptan and ibuprofen all at the same time and often goes to sleep.    Pt is taking topirimate po bid.  Pt is taking emgality q month.    Pt w/ no questions or concerns. States has been educated about CKD but could use the pamphlet for follow up.  Pt not drinking sodas, eating chocolate, nor drinking alcohol.   Discussed long term health will be much affected by taking good care of himself/BP now. Verbalizes understanding and plans to buy BP cuff Monday (and get labs drawn Monday @ [facility])

## 2019-07-10 NOTE — Unmapped (Signed)
I was the supervising physician in the delivery of the service. Demia Viera D Chip Canepa, MD

## 2019-07-16 ENCOUNTER — Ambulatory Visit
Admit: 2019-07-16 | Discharge: 2019-07-24 | Payer: MEDICARE | Attending: Rehabilitative and Restorative Service Providers" | Primary: Rehabilitative and Restorative Service Providers"

## 2019-07-16 ENCOUNTER — Ambulatory Visit: Admit: 2019-07-16 | Discharge: 2019-07-17 | Payer: MEDICARE

## 2019-07-16 DIAGNOSIS — G43709 Chronic migraine without aura, not intractable, without status migrainosus: Secondary | ICD-10-CM

## 2019-07-16 DIAGNOSIS — I10 Essential (primary) hypertension: Secondary | ICD-10-CM

## 2019-07-16 DIAGNOSIS — N183 Chronic kidney disease, stage 3 (moderate): Principal | ICD-10-CM

## 2019-07-16 DIAGNOSIS — G8929 Other chronic pain: Secondary | ICD-10-CM

## 2019-07-16 DIAGNOSIS — M542 Cervicalgia: Secondary | ICD-10-CM

## 2019-07-16 LAB — RENAL FUNCTION PANEL
ANION GAP: 6 mmol/L — ABNORMAL LOW (ref 7–15)
BLOOD UREA NITROGEN: 11 mg/dL (ref 7–21)
BUN / CREAT RATIO: 9
CALCIUM: 9.6 mg/dL (ref 8.5–10.2)
CHLORIDE: 110 mmol/L — ABNORMAL HIGH (ref 98–107)
CO2: 25 mmol/L (ref 22.0–30.0)
CREATININE: 1.16 mg/dL (ref 0.70–1.30)
EGFR CKD-EPI AA MALE: >90 mL/min/{1.73_m2} (ref >=60)
EGFR CKD-EPI NON-AA MALE: 80 mL/min/{1.73_m2} (ref >=60)
GLUCOSE RANDOM: 90 mg/dL (ref 70–179)
PHOSPHORUS: 3.3 mg/dL (ref 2.9–4.7)
POTASSIUM: 4 mmol/L (ref 3.5–5.0)
SODIUM: 141 mmol/L (ref 135–145)

## 2019-07-16 LAB — EGFR CKD-EPI AA MALE: Lab: >90

## 2019-07-16 LAB — BLOOD UREA NITROGEN: Urea nitrogen:MCnc:Pt:Ser/Plas:Qn:: 11

## 2019-07-16 NOTE — Unmapped (Signed)
[facility] ALLIED HEALTH PT [facility]  OUTPATIENT PHYSICAL THERAPY  07/16/2019          Patient Name: Glenn Kennedy  Date of Birth:1980/12/22   Visit #: 1  Diagnosis:   Encounter Diagnoses   Name Primary?   ??? Neck pain, chronic Yes   ??? Cervicalgia    ??? Chronic migraine without aura without status migrainosus, not intractable      Referring MD:  Mercie Eon*     Plan of Care Effective Date: 04/26/2019 - 8/5/20206/5/20 to 06/26/19      Assessment & Plan     Assessment details: Large reduction in facet mobility on left as well as increase in tissue trigger points throughout cervical paraspinals and suboccipitals on left. Initial restrictions in left rotation by 20 deg, reduced  limitations of active motion by 5 deg by end of session. Dry needling performed with no adverse reactions to minimize trigger points and restore active motion. Additional skilled PT remains warranted to reduce pain and promote full cervical and scapular mobility.       Impairments: decreased endurance, pain, decreased strength, decreased range of motion, core weakness, impaired flexibility, impaired motor control, joint restriction, poor awareness of body mechanics, postural weakness and impaired tone        Personal Factors/Comorbidities: 3+  Specific Comorbidities: chronicity of condition, mild cognitive ipmairment, HTN, CKD, tobacco use     Body System: MSK, neuro, integ, functional limitations, participation restrictions    Clinical Presentation: stable  Clinical Decision Making: moderate    Prognosis: fair      Positive Prognosis Rationale: age, motivated for treatment, caregiver/family support, response to trial tx and previous tx with benefit.   Negative Prognosis Rationale: insight, financial status, medical status/condition, chronicity of condition, severity of symptoms, body habitus, endurance and strength.  Barriers to therapy: financial         Therapy Goals  Goals: In 2 mos:   1. The patient will demonstrate independent performance of HEP for symptom management and to maintain functional gains.   2. B cervical rotation AROM at least 65 deg to improve ability to look over shoulders while driving.   3. Pt able to perform overhead press with at least 10#/10# B demonstrating good posture and mechanics for return to weight lifting to promote MSK health.      Plan  Therapy options: will be seen for skilled physical therapy services    Planned therapy interventions: education - patient, endurance activites, functional mobility, gait training, home exercise program, therapeutic exercises, therapeutic activities, manual therapy, neuromuscular re-education, postural training, body mechanics training, taping and TENs    Other planned therapy interventions: TrP DN       Frequency: 1x week    Duration in weeks: up to 2 mos    Education provided to: patient.  Education provided: anatomy, importance of Therapy, role of therapy in Rehabilitation, treatment options and plan, posture, HEP, body mechanics, indications/contraindications to exercises, body awareness and symptom management (DN post-care)  Education Results: needs further instruction.  Communication/Consultation: Initial note sent to Referring Provider and Medicare Cert/POC sent to Referring Provider.  Next visit plan: Scapular and cervical mobility with focus on cervical range of motion   Total Session Time: 40  Treatment rendered today: Manual therapy (30) -  - PT facilitated overpressure into L sidebend/L rotation, R sidebend/R rotation  - STM to L cervical paraspinals  - suboccipital release bil   - mobilization into opening of R cervical facets paired  with PROM of pt into R cervical sidebending  - contract/relax of L rotation L sidebend + STM to posterior scalene  Intramuscular Therapy: 10 minutes  Bart Ronnald Nian Samano ??was informed of the risks associated with dry needling and he signed consent form.?? Patient screened for any contraindications to needling including pregnancy, infection, antibiotic use, anticoagulants use, autoimmune issues, uncontrolled diabetes, and active cancer/treatment and does not have any of these conditions.  DRY NEEDLING FOR RELEASING IDENTIFIED TRIGGER POINTS TO FACILITATE MANUAL THERAPY TECHNIQUES AND ALLOW MUSCLE TO RETURN TO OPTIMAL LENGTH (NO CHARGE).  Cervical paraspinals, upper trap, and levator scap trigger point with 30mm x .30mm with Pinch/Pull Technique established for safety - Local Twitch Response produced????             Subjective     History of Present Illness  Date of Onset: 04/25/2014    Date of Evaluation: 04/26/2019    Reason for Referral/Chief Complaint: Chronic neck pain and migraines       Subjective: Patient reports he has a headache every now an then, but neck stiffness has increased as well. Difficulty driving and turning head to left.     Quality of life: fair          Pain      Current pain rating: 6      At best pain rating: 0      At worst pain rating: 8      Location: UT region, usually L, occasionally R       Quality: tight, tightness, pulling and aching      Relieving factors: medications (injections)      Aggravating factors: sleeping (shrugging shoulders, neck rotation)      Pain Related Behaviors: none    Progression: no change      Red flags: none    Prior Functional Status: No physical limitations.        Current functional status: disturbed sleep and limited recreation      Precautions and Equipment  Precautions: None  Current Braces/Orthoses: None  Equipment Currently Used: None    Social Support  Lives in: [place]  Lives with: mother and young children  Hand dominance: right  Communication Preference: verbal, written and visual   Barriers to Learning: No Barriers  Work/School: on disability, previously enjoyed playing basketball and weight lifting     Diagnostic Tests                              Diagnostic Test Comments: none recently     Treatments      Previous treatment: injection treatment and medication Patient Goals  Patient goals for therapy: decreased pain, increased strength, increased ROM, improved sleep, return to recreational activites and return to sport/leisure activities      Patient goal: return to basketball and weight lifting          Objective      Resting BP: 134/92    Posture/Observations:   Sitting: forward head, rounded shoulders, moderate thoracic kyphosis --> no change in pain with correction  Standing: forward head, rounded shoulders, moderate thoracic kyphosis    Range of Motion   0-24%   range available 25-49%   range available 50-74% range available  75+%   range available AROM PROM Comments   Flexion    x   Pulling/stretching on back   Extension    x   No change   R Rotation  x    Increased L sided pain   L Rotation    x   No change   R Side bend   x    Increased L sided pain   L Side bend    x   Increased L sided pain   Protraction          Retraction            Repeated Motions: NT    Shoulder AROM Screen: pain B with HBH and HBB positions on corresponding sides     UE Sensation- Light Touch (Dermatomes): unremarkable    UE Strength (Myotomes)  MMT Right Left Comments   Shoulder Shrug (C4) 5/5 5/5 Pain on L    Shoulder Abduction (C5) 4/5 4/5 Pain on L   Shoulder Flexion 4/5 4/5 Pain on L    Shoulder ER @ side 4/5 4/5    Shoulder IR @ side 4/5 4/5    Elbow Flexion (C6)      Extension Extension (C7)      Wrist Extension (C6)      Wrist Flexion (C7)      Thumb Extension (C8)      Finger Adduction (T1)        Reflexes: not indicated     Special Tests   Right Left Comments   Alar Ligament      Sharp-Purser            Spurling's A  -    Distraction  -    ULTT-A        Palpation/Segmental Motion/Joint Play:  - TTP: significant with hypertonicity & myofascial TrPs @ L UT, levator scap  - CPA: non-specific pain may be more associated w/ pressure rather than PA @ c/spine, gross t/spine hypomobility  - UPA:                                               Current HEP  Cervical rotation & SB stretches throughout the day      I attest that I have reviewed the above information.  Signed: Rosaria Ferries, PT,   07/16/2019 1:39 PM

## 2019-07-18 MED ORDER — RIZATRIPTAN 10 MG DISINTEGRATING TABLET
ORAL_TABLET | Freq: Once | ORAL | 1 refills | 50.00000 days | Status: CP | PRN
Start: 2019-07-18 — End: 2019-09-16
  Filled 2019-07-30: qty 18, 30d supply, fill #0

## 2019-07-18 NOTE — Unmapped (Signed)
[facility] Refill Coordination Note    Specialty Medication(s) to be Shipped:   General Specialty: emgality 120mg /ml    Other medication(s) to be shipped:  Rizatriptan-faxed dr,Tizantidine     Glenn Kennedy, DOB: 02-27-1981  Phone: There are no phone numbers on file.      All above HIPAA information was verified with patient.     Completed refill call assessment today to schedule patient's medication shipment from the [facility] [phone]).       Specialty medication(s) and dose(s) confirmed: Regimen is correct and unchanged.   Changes to medications: Glenn Kennedy reports no changes at this time.  Changes to insurance: No  Questions for the pharmacist: No    Confirmed patient received Welcome Packet with first shipment. The patient will receive a drug information handout for each medication shipped and additional FDA Medication Guides as required.       DISEASE/MEDICATION-SPECIFIC INFORMATION        N/A    SPECIALTY MEDICATION ADHERENCE     Medication Adherence    Patient reported X missed doses in the last month: 0  Specialty Medication: emgality 120mg /ml  Patient is on additional specialty medications: No                Emgality 120 mg/ml: 0 days of medicine on hand       SHIPPING     Shipping address confirmed in Epic.     Delivery Scheduled: Yes, Expected medication delivery date: 09/09.     Medication will be delivered via UPS to the home address in Epic WAM.    Glenn Kennedy   [facility] Specialty Technician

## 2019-07-18 NOTE — Unmapped (Signed)
This patient has been disenrolled from the [facility] specialty pharmacy services due to [facility] removed from specialty list.    [name]  [facility] Specialty Pharmacist

## 2019-07-30 MED FILL — RIZATRIPTAN 10 MG DISINTEGRATING TABLET: 30 days supply | Qty: 18 | Fill #0 | Status: AC

## 2019-07-30 MED FILL — TIZANIDINE 2 MG TABLET: 10 days supply | Qty: 60 | Fill #1 | Status: AC

## 2019-07-30 MED FILL — EMGALITY PEN 120 MG/ML SUBCUTANEOUS PEN INJECTOR: 30 days supply | Qty: 1 | Fill #3 | Status: AC

## 2019-07-30 MED FILL — EMGALITY PEN 120 MG/ML SUBCUTANEOUS PEN INJECTOR: SUBCUTANEOUS | 30 days supply | Qty: 1 | Fill #3

## 2019-07-30 MED FILL — TIZANIDINE 2 MG TABLET: ORAL | 10 days supply | Qty: 60 | Fill #1

## 2019-09-07 ENCOUNTER — Ambulatory Visit: Admit: 2019-09-07 | Discharge: 2019-09-07 | Disposition: A | Discharge status: Home / Self Care | Payer: MEDICARE

## 2019-09-07 DIAGNOSIS — G43009 Migraine without aura, not intractable, without status migrainosus: Principal | ICD-10-CM

## 2019-09-07 NOTE — Unmapped (Signed)
Pt reports posterior h/a with photophobia x 3 days unrelieved by Tylenol.

## 2019-09-07 NOTE — Unmapped (Signed)
[facility]  Emergency Department Physician Note    ED Clinical Impression     Final diagnoses:   Migraine without aura and without status migrainosus, not intractable (Primary)       Initial Impression, ED Course, Assessment and Plan     Impression: Glenn Kennedy is a 38 y.o. male with a PMH of HTN and chronic migraines presenting with a headache, similar to prior migraine headaches.    On exam, patient is well-appearing and in NAD. His vitals are unremarkable. Patient has no gross focal neurologic deficits appreciated. Cranial nerves II-XII intact. Strength is normal in all extremities.  No sensory deficits. No nuchal rigidity. No petechiae.    Regarding the patient's headache, I have a low suspicion for subarachnoid hemorrhage given the fact that the patient's headache was not thunderclap in onset and is not the most severe headache of the patient's life. Furthermore, the patient has no neurologic deficits. Low suspicion for CVA given the lack of focal neurologic deficit. I have a low suspicion for CNS infection given the lack of nuchal rigidity, meningeal findings or infectious symptoms. Suspect chronic migraine exacerbation.     Plan for migraine cocktail + Tylenol and will re-assess.    1:26 PM  On reevaluation, patient states he is feeling relief after receiving migraine cocktail. No longer has a headache. Discussed discharge and patient is agreeable with this plan.  Patient has agreed to follow-up with his neurologist regarding his medications. Strict return precautions were reviewed with the patient. The patient was advised to return to the emergency department with any troubling symptoms. The patient is comfortable with this plan and will be discharged.      Additional Medical Decision Making I have reviewed the vital signs and the nursing notes. Labs and radiology results that were available during my care of the patient were independently reviewed by me and considered in my medical decision making.     I have staffed the case with the ED Attending, Dr. Danella Sensing.    Portions of this record have been created using Scientist, clinical (histocompatibility and immunogenetics). Dictation errors have been sought, but may not have been identified and corrected.  ____________________________________________       History     Chief Complaint  Headache Recurrent or Known Dx Migraines    HPI   Glenn Kennedy is a 38 y.o. male with a PMH of HTN, Stage III CKD, chronic neck pain, and chronic migraines presents to the ED with a headache. Patient reports 3 days of a headache with associated photophobia. He states this migraine is similar to his prior chronic migraines. Not sudden in onset, not the worst headache of his life.  No vision changes.  No weakness, numbness, or tingling.  No fevers or chills. No nausea or vomiting.  No rashes.  Patient has taken ibuprofen without relief. Patient notes that he usually receives an Emgality injection once a month but missed his last appointment and did not receive this injection.       Past Medical History:   Diagnosis Date   ??? Hypertension    ??? Migraines    ??? Neck pain, chronic      Patient Active Problem List   Diagnosis   ??? Chronic migraine without aura without status migrainosus, not intractable   ??? Hypertension   ??? Neck pain, chronic   ??? Enrolled in chronic care management   ??? Personal history of tobacco use, presenting hazards to health   ???  CKD (chronic kidney disease) stage 3, GFR 30-59 ml/min       No past surgical history on file.      Current Facility-Administered Medications:   ???  influenza vaccine quad (FLUARIX, FLULAVAL, FLUZONE) (6 MOS & UP) 2020-21, 0.5 mL, Intramuscular, During hospitalization, Rodrigo Ran, MD    Current Outpatient Medications: ???  amLODIPine (NORVASC) 5 MG tablet, Take 1 tablet (5 mg total) by mouth daily., Disp: 90 tablet, Rfl: 3  ???  diphenhydrAMINE (BENADRYL) 25 mg capsule, Take 1 capsule (25 mg total) by mouth every four (4) hours as needed for itching (headache). (Patient not taking: Reported on 07/02/2019), Disp: 30 capsule, Rfl: 0  ???  galcanezumab-gnlm (EMGALITY) 120 mg/mL injection, Inject the contents of 1 pen (120 mg) under the skin every thirty (30) days., Disp: 1 mL, Rfl: 11  ???  ibuprofen (MOTRIN) 600 MG tablet, Take 1 tablet (600 mg total) by mouth every eight (8) hours as needed for pain (take as needed for Headache)., Disp: 90 tablet, Rfl: 1  ???  propranolol (INDERAL) 80 MG tablet, Take 1 tablet (80 mg total) by mouth Two (2) times a day., Disp: 180 tablet, Rfl: 3  ???  rizatriptan (MAXALT-MLT) 10 MG disintegrating tablet, Dissolve 1 tablet (10 mg total) on the tongue once as needed for migraine. May repeat in 2 hours if needed, Disp: 30 tablet, Rfl: 1  ???  topiramate (TOPAMAX) 100 MG tablet, Take 1 tablet (100 mg total) by mouth Two (2) times a day., Disp: 180 tablet, Rfl: 3    Allergies  Patient has no known allergies.    Family History   Problem Relation Age of Onset   ??? Stroke Mother 70        hemorrhagic   ??? Hyperlipidemia Mother    ??? Hypertension Mother    ??? Migraines Mother    ??? No Known Problems Father    ??? No Known Problems Brother    ??? Migraines Son    ??? No Known Problems Maternal Grandmother    ??? No Known Problems Maternal Grandfather      Social History  Social History     Tobacco Use   ??? Smoking status: Current Every Day Smoker     Packs/day: 0.50     Years: 15.00     Pack years: 7.50     Types: Cigarettes     Start date: 06/01/1997   ??? Smokeless tobacco: Never Used   Substance Use Topics   ??? Alcohol use: No     Alcohol/week: 0.0 standard drinks   ??? Drug use: No     Review of Systems  Constitutional: Negative for fever.  Eyes: Negative for visual changes.  ENT: Negative for sore throat.  CV: Negative for chest pain. Resp: Negative for shortness of breath.  GI: Negative for abdominal pain.  GU: Negative for dysuria.  MSK: Negative for back pain.  Derm: Negative for rash.  Neuro: Positive for headaches.    Physical Exam     ED Triage Vitals [09/07/19 1237]   Enc Vitals Group      BP 128/64      Heart Rate 70      SpO2 Pulse       Resp 18      Temp 36.8 ??C (98.2 ??F)      Temp Source Oral      SpO2 98 %      Weight 100.2 kg (221 lb)  Height 1.702 m (5' 7)     Constitutional: Appears stated age.  HEENT: Normocephalic. Conjunctivae clear. No congestion. Moist mucous membranes.   Heme/Lymph/Immuno: No petechiae or bruising  CV: Regular rate and rhythm. Warm well perfused.   Resp: Clear to auscultation bilaterally.   GI: Soft, non tender, non distended.   GU: No suprapubic tenderness  MSK: Normal range of motion in all extremities.  Neuro: Normal speech. No gross focal neurologic deficits appreciated. Cranial nerves II-XII intact. Strength is normal in all extremities. Normal sensation.  Skin: Warm, dry, intact.  Psychiatric: Mood and affect are normal.     Radiology     No results found.     Documentation assistance was provided by Jacqualyn Posey, Scribe, on September 07, 2019 at 12:48 PM Jarvis Morgan, MD.     Documentation assistance was provided by the scribe in my presence.  The documentation recorded by the scribe has been reviewed by me and accurately reflects the services I personally performed.        Jarvis Morgan, MD  Emergency Medicine PGY-2       Jarvis Morgan, MD  Resident  09/07/19 [phone]

## 2019-09-09 DIAGNOSIS — M542 Cervicalgia: Principal | ICD-10-CM

## 2019-09-09 DIAGNOSIS — G43709 Chronic migraine without aura, not intractable, without status migrainosus: Principal | ICD-10-CM

## 2019-09-09 DIAGNOSIS — G8929 Other chronic pain: Principal | ICD-10-CM

## 2019-09-09 DIAGNOSIS — I10 Essential (primary) hypertension: Principal | ICD-10-CM

## 2019-09-09 NOTE — Unmapped (Signed)
CCM Follow-up Call          PCP Action:None   Neurology action: none     CCM Action: follow up BP meds and HA meds . Pt will receive emgality in mail 09/11/19. Pt admits did run out of BP pills.   Emgality was a specialized medication until July and so pt used to get reminder calls. Now that it is not considered specialty, pharmacy does not remind pt to re-order.  Pt does not do my chart. (could request refill via my chart)  Pt does have emgality ph# [phone] and did call this morning to get the refill sent to his home.  CCM and pt discussed starting a pill box so aware when running out of medications becomes aware prior to completely empty bottle.    Also discussed the importance of not running out of BP medications both to refrain from HAs but  to help kidneys and all major organs as well.      Patient Action:call CCM or [facility] nurse for any concerns.  continue w/ BP medications daily and emgality q month. Start a PILL Box to help from running out of medications.       Follow up/Discuss next call: HA, HTN, BP meds [facility], [employer] [employer] [facility], or would pt like to start getting all medications from shared services?    Pt states recent BP was 128/64, 70. Temp. 98.2 denies HA today.  States after had HA on and off last week, Saturday morning HA would not go away and went to ER. Does understand to stay on top of medications and to NOT wait for pharmacy to call him for reminders.  Pt's mom is aware as well and will call CCM for any questions/concers if needed.        Reviewed/update care plan with patient?Yes.- pt does not feel needs f/u w/ neurology nor pcp prior to current scheduled appts.       Review last clinic note and medical plan with patient. Did patient follow plan- No. --If no why? Was there a barrier?low health literacy      Reviewed/update SDOH:yes/partial Are you taking medications as prescribed by provider?now pt states has all medications and emgality is being sent this week. denies need for any refills  Review medication list(note any discrepancies, changes or alterations in way medications are taken from providers directions).  Any questions about medications?No.  Any refills needed?No.        Need any additional medical supplies or equipment?No.      Any new issues or concerns with finances, transportation or any other barriers preventing you from obtaining healthcare?      Review upcoming clinic appointments if any and if transportation is needed.  (Ask patient if an appointment is needed /or reschedule an already existing appointment)        Offered [facility], Urgent Care and [facility] information/Education?  Yes. pt states has all numbers.         Does patient have any other questions or concerns about their health? No

## 2019-09-11 MED FILL — EMGALITY PEN 120 MG/ML SUBCUTANEOUS PEN INJECTOR: 30 days supply | Qty: 1 | Fill #4 | Status: AC

## 2019-09-11 MED FILL — EMGALITY PEN 120 MG/ML SUBCUTANEOUS PEN INJECTOR: SUBCUTANEOUS | 30 days supply | Qty: 1 | Fill #4

## 2019-09-30 ENCOUNTER — Ambulatory Visit
Admit: 2019-09-30 | Discharge: 2019-10-22 | Payer: MEDICARE | Attending: Rehabilitative and Restorative Service Providers" | Primary: Rehabilitative and Restorative Service Providers"

## 2019-09-30 NOTE — Unmapped (Signed)
[facility] [place]  OUTPATIENT PHYSICAL THERAPY  09/30/2019  Note Type: Re-evaluation/Progress Note       Patient Name: Glenn Kennedy  Date of Birth:10-12-1981   Visit #: 1  Diagnosis:   Encounter Diagnoses   Name Primary?   ??? Neck pain, chronic Yes   ??? Cervicalgia    ??? Chronic migraine without aura without status migrainosus, not intractable      Referring MD:  Mercie Eon*     Plan of Care Effective Date: 04/26/19 to 06/26/19      Assessment & Plan     Assessment details: Performed re-eval since pt's last physical therapy visit was on 09/15/19. Pt continues to report chronic neck and bilateral posterior shoulder pain. Continues to demonstrate range of motion and strength deficits. Reported a high level of pain at beginning of session (9/10), but reported a significant decrease in pain at the end of session (3/10), after manual therapy and stretching therex. Pt will continue to benefit from skilled PT services in order to address impairments.       Impairments: decreased endurance, pain, decreased strength, decreased range of motion, core weakness, impaired motor control, joint restriction, poor awareness of body mechanics, postural weakness and impaired tone        Personal Factors/Comorbidities: 3+  Specific Comorbidities: chronicity of condition, mild cognitive impairment, HTN, CKD, tobacco use   Examination of Body Systems: 4+ elements  Body System: MSK, neuro, functional limitations, participation restrictions    Clinical Presentation: stable  Clinical Decision Making: moderate    Prognosis: fair      Positive Prognosis Rationale: age, motivated for treatment, caregiver/family support, response to trial tx and previous tx with benefit.   Negative Prognosis Rationale: insight, medical status/condition, chronicity of condition, severity of symptoms, body habitus, endurance and strength.        Therapy Goals Goals: In 3 weeks, pt will be independent with HEP and able to demonstrate home exercises with appropriate mechanics  In 5 weeks, pt will demonstrate cervical ROM WFL with no increase in pain in order to perform daily activities such as looking at blindspot while driving  In 8 weeks, pt will demonstrate 5/5 UE strength in order to return to basketball and weight lifting activities      Plan  Therapy options: will be seen for skilled physical therapy services    Planned therapy interventions: education - patient, endurance activites, functional mobility, gait training, home exercise program, therapeutic exercises, therapeutic activities, manual therapy, neuromuscular re-education, postural training, body mechanics training, taping and TENs    Other planned therapy interventions: TrP DN       Frequency: 1x week    Duration in weeks: 8    Education provided to: patient.  Education provided: importance of Therapy, role of therapy in Rehabilitation, treatment options and plan, posture, HEP, indications/contraindications to exercises, body awareness and symptom management (DN post-care)  Education Results: verbalized good understanding.    Next visit plan: STM, stretching and strengthening therex, TrP DN  Total Session Time: 45  Treatment rendered today: PT re-eval 20 min    Manual therapy: 15 min   P-A mobilizations grade 1-2 C3-C7  Lateral glides grade 1-2 C3-C7  STM upper trap with cervical side bend (bilaterally)  Suboccipital release-2 min    Therex: 10 min  Upper trap stretch bilateral 1x10 - added to HEP  Cervical rotation stretch bilateral 1x10 - added to HEP  Scapular retraction 1x10- verbal/tactile cueing for appropriate scapular  activation - added to HEP          Subjective     History of Present Illness  Date of Onset: 04/25/2014    Date of Evaluation: 09/30/2019    Reason for Referral/Chief Complaint: Chronic neck pain and migraines Subjective: Continues to have chronic migraines and bilateral neck pain. Went to ED on 10/17 for severe migraine symptoms. Reports improvement in migraine symptoms after medical treatments during ED visit. Has performed general neck stretches but have not consistently performed previous home exercises since last PT visit (07/16/19). Reports that dry needling was beneficial for pain during his previous PT visits.      Quality of life: fair          Pain      Current pain rating: 8      At best pain rating: 4      At worst pain rating: 9      Location: bilateral neck and posterior shoulder pain (L>R)      Quality: aching and throbbing      Relieving factors: heat      Aggravating factors: sleeping (neck rotation (L>R))      Pain Related Behaviors: none    Progression: no change      Red flags: none    Prior Functional Status: No physical limitations.        Current functional status: disturbed sleep and limited recreation      Precautions and Equipment  Precautions: None  Current Braces/Orthoses: None  Equipment Currently Used: None    Social Support  Lives in: [place] house  Lives with: mother and young children  Hand dominance: right  Communication Preference: verbal, written and visual   Barriers to Learning: No Barriers  Work/School: on disability, previously enjoyed playing basketball and weight lifting     Diagnostic Tests                              Diagnostic Test Comments: none recently     Treatments      Previous treatment: injection treatment and medication                      Patient Goals  Patient goals for therapy: decreased pain, increased strength, increased ROM, improved sleep, return to recreational activites and return to sport/leisure activities      Patient goal: return to basketball and weight lifting          Objective     Postural Observations  Seated posture: fair  Standing posture: fair        Palpation   Left   Hypertonic in the upper trapezius.   Tenderness of the upper trapezius. Right   Hypertonic in the upper trapezius. Tenderness of the upper trapezius.     Tenderness     Additional Tenderness Details  Tenderness across bilateral upper trapezius    Neurological Testing     Sensation   Cervical/Thoracic   Left   Intact: light touch    Right   Intact: light touch        Posture/Observations:   Sitting: forward head, rounded shoulders, moderate thoracic kyphosis  Standing: forward head, rounded shoulders, moderate thoracic kyphosis    Range of Motion   0-24%   range available 25-49%   range available 50-74% range available  75+%   range available AROM PROM Comments   Flexion    x  Extension    x   Increased bilateral neck pain   R Rotation    x   Increased bilateral neck pain   L Rotation    x   Increased bilateral neck pain (worse than R)   R Side bend   x    Increased bilateral neck pain   L Side bend   x    Increased bilateral neck pain (worse than R)   Protraction    x   Increased bilateral neck pain   Retraction   x    Pulling R side of neck     Repeated Motions: NT    UE Strength (Myotomes)  MMT Right Left Comments   Shoulder Shrug (C4)      Shoulder Abduction (C5) 4+/5 4/5    Shoulder Flexion       Shoulder ER @ side 4/5 4/5    Shoulder IR @ side 4/5 4/5    Elbow Flexion (C6) 5/5 5/5    Extension Extension (C7) 5/5 5/5    Wrist Extension (C6)      Wrist Flexion (C7)      Thumb Extension (C8)      Finger Adduction (T1)                                      I attest that I have reviewed the above information.  Signed: Lanae Crumbly, PT  09/30/2019 5:41 PM

## 2019-10-25 ENCOUNTER — Ambulatory Visit
Admit: 2019-10-25 | Discharge: 2019-11-21 | Payer: MEDICARE | Attending: Rehabilitative and Restorative Service Providers" | Primary: Rehabilitative and Restorative Service Providers"

## 2019-10-25 NOTE — Unmapped (Signed)
[facility]  OUTPATIENT PHYSICAL THERAPY  10/25/2019          Patient Name: Glenn Kennedy  Date of Birth:26-May-1981   Visit #: 1  Diagnosis:   Encounter Diagnoses   Name Primary?   ??? Neck pain, chronic Yes   ??? Cervicalgia    ??? Chronic migraine without aura without status migrainosus, not intractable      Referring MD:  Mercie Eon*     Plan of Care Effective Date: 04/26/19 to 06/26/19      Assessment & Plan     Assessment details: Pt continues to present with a relatively high level of neck pain. Pt reported a lower increase in pain when a shorter range was performed during cervical stretching exercises. Pt tolerated scapular strengthening exercises with no increase in pain. Pt continues to report significant relief of pain after manual therapy. Will plan to receive dry needling in next session with PT certified in TrP DN. Reinforced importance of consistently attending PT sessions.      Impairments: decreased endurance, pain, decreased strength, decreased range of motion, core weakness, impaired motor control, joint restriction, poor awareness of body mechanics, postural weakness and impaired tone        Personal Factors/Comorbidities: 3+  Specific Comorbidities: chronicity of condition, mild cognitive impairment, HTN, CKD, tobacco use   Examination of Body Systems: 4+ elements  Body System: MSK, neuro, functional limitations, participation restrictions    Clinical Presentation: stable  Clinical Decision Making: moderate    Prognosis: fair      Positive Prognosis Rationale: age, motivated for treatment, caregiver/family support, response to trial tx and previous tx with benefit.   Negative Prognosis Rationale: insight, medical status/condition, chronicity of condition, severity of symptoms, body habitus, endurance and strength.        Therapy Goals  Goals: In 3 weeks, pt will be independent with HEP and able to demonstrate home exercises with appropriate mechanics- PROGRESSING In 5 weeks, pt will demonstrate cervical ROM WFL with no increase in pain in order to perform daily activities such as looking at blindspot while driving  In 8 weeks, pt will demonstrate 5/5 UE strength in order to return to basketball and weight lifting activities      Plan  Therapy options: will be seen for skilled physical therapy services    Planned therapy interventions: education - patient, endurance activites, functional mobility, gait training, home exercise program, therapeutic exercises, therapeutic activities, manual therapy, neuromuscular re-education, postural training, body mechanics training, taping and TENs    Other planned therapy interventions: TrP DN       Frequency: 1x week    Duration in weeks: 8    Education provided to: patient.  Education provided: importance of Therapy, role of therapy in Rehabilitation, treatment options and plan, posture, HEP, indications/contraindications to exercises, body awareness and symptom management (DN post-care)  Education Results: verbalized good understanding.    Next visit plan: STM, stretching and strengthening therex, TrP DN  Total Session Time: 42  Treatment rendered today: Manual therapy: 18 min   P-A and unilateral mobilizations grade 1-3 C3-T1  Lateral glides grade 1-2 C3-C7  STM upper trap with neutral cervical alignment and cervical side bend (bilaterally)    Therex: 24 min  Upper trap stretch bilateral 1x10- cues on shortening to tolerable range and maintaining appropriate posture  Cervical rotation stretch bilateral 1x10- cues on shortening to tolerable range   Scapular retraction 1x10- verbal/tactile cueing for appropriate scapular activation -  Row with green band- v cues for appropriate cervical alignment-added to HEP  shld ext with green band-added to HEP          Subjective     History of Present Illness  Date of Onset: 04/25/2014    Date of Evaluation: 09/30/2019    Reason for Referral/Chief Complaint: Chronic neck pain and migraines Subjective: Reports having similar neck pain and tension since previous session. Has performed home exercises, which have not improved pain. Long range cervical motions in any direction increase pain. Has not had as many migraines lately. Reports that he was not able to attend the last couple of PT sessions due to not having transportation to the clinic.       Quality of life: fair          Pain      Current pain rating: 8      At best pain rating: 7      At worst pain rating: 9      Location: bilateral neck and posterior shoulder pain (L>R)      Quality: aching and throbbing      Relieving factors: heat      Aggravating factors: sleeping (neck rotation (L>R))      Pain Related Behaviors: none    Progression: no change      Red flags: none    Prior Functional Status: No physical limitations.        Current functional status: disturbed sleep and limited recreation      Precautions and Equipment  Precautions: None  Current Braces/Orthoses: None  Equipment Currently Used: None    Social Support  Lives in: [place] house  Lives with: mother and young children  Hand dominance: right  Communication Preference: verbal, written and visual   Barriers to Learning: No Barriers  Work/School: on disability, previously enjoyed playing basketball and weight lifting     Diagnostic Tests                              Diagnostic Test Comments: none recently     Treatments      Previous treatment: injection treatment and medication                      Patient Goals  Patient goals for therapy: decreased pain, increased strength, increased ROM, improved sleep, return to recreational activites and return to sport/leisure activities      Patient goal: return to basketball and weight lifting          Objective     Postural Observations  Seated posture: fair  Standing posture: fair        Palpation   Left   Hypertonic in the upper trapezius.   Tenderness of the upper trapezius.     Right Hypertonic in the upper trapezius. Tenderness of the upper trapezius.     Neurological Testing     Sensation   Cervical/Thoracic   Left   Intact: light touch    Right   Intact: light touch        Posture/Observations:   Sitting: forward head, rounded shoulders, moderate thoracic kyphosis  Standing: forward head, rounded shoulders, moderate thoracic kyphosis    Range of Motion   0-24%   range available 25-49%   range available 50-74% range available  75+%   range available AROM PROM Comments   Flexion    x  Extension    x   Increased bilateral neck pain   R Rotation    x   Increased bilateral neck pain   L Rotation    x   Increased bilateral neck pain (worse than R)   R Side bend   x    Increased bilateral neck pain   L Side bend   x    Increased bilateral neck pain (worse than R)   Protraction    x   Increased bilateral neck pain   Retraction   x    Pulling R side of neck     Repeated Motions: NT    UE Strength (Myotomes)  MMT Right Left Comments   Shoulder Shrug (C4)      Shoulder Abduction (C5) 4+/5 4/5    Shoulder Flexion       Shoulder ER @ side 4/5 4/5    Shoulder IR @ side 4/5 4/5    Elbow Flexion (C6) 5/5 5/5    Extension Extension (C7) 5/5 5/5    Wrist Extension (C6)      Wrist Flexion (C7)      Thumb Extension (C8)      Finger Adduction (T1)                                      I attest that I have reviewed the above information.  Signed: Lanae Crumbly, PT  10/25/2019 5:23 PM

## 2019-10-31 ENCOUNTER — Ambulatory Visit
Admit: 2019-10-31 | Discharge: 2019-10-31 | Disposition: A | Discharge status: Home / Self Care | Payer: MEDICARE | Attending: Family

## 2019-10-31 DIAGNOSIS — B9789 Other viral agents as the cause of diseases classified elsewhere: Principal | ICD-10-CM

## 2019-10-31 DIAGNOSIS — J069 Acute upper respiratory infection, unspecified: Principal | ICD-10-CM

## 2019-10-31 NOTE — Unmapped (Signed)
Pt reports chest congestion and intermittent cough x 4 days. Pt denies pain or fever. Pt with no other symptoms or concerns

## 2019-10-31 NOTE — Unmapped (Signed)
[facility]  Emergency Department Provider Note          ED Clinical Impression      Final diagnoses:   Viral URI with cough (Primary)           Impression, ED Course, Assessment and Plan      Impression: Glenn Kennedy is a 38 y.o. male with a PMH of HTN, chronic migraines and neck pain presenting to the ED for 4 days of cough and congestion. On exam, patient is nontoxic appearing and in NAD. Occasional cough, lungs CTAB. HRRR. Suspect likely viral URI Plan for COVID-19/flu/rsv swab, then will discharge patient home with instructions for symptomatic treatment and isolation precautions.     Additional Medical Decision Making and Disclaimers     Glenn Kennedy was evaluated in Emergency Department at the time of this visit for the symptoms described in the history of present illness. He was evaluated in the context of the global COVID-19 pandemic, which necessitated consideration that the patient might be at risk for infection with the SARS-CoV-2 virus that causes COVID-19. Institutional protocols and algorithms that pertain to the evaluation of patients at risk for COVID-19 were followed during the patient's care in the ED.    I have reviewed the vital signs and the nursing notes. Labs and radiology results that were available during my care of the patient were independently reviewed by me and considered in my medical decision making. I reviewed the patient's prior medical records (ED).       Portions of this record have been created using Scientist, clinical (histocompatibility and immunogenetics). Dictation errors have been sought, but may not have been identified and corrected.  ____________________________________________         History        Chief Complaint  Congestion      HPI Glenn Kennedy is a 38 y.o. male with a PMH of HTN, chronic migraines and neck pain presenting to the ED for congestion. Patient endorses upper respiratory congestion and cough that onset 4 days ago. He states that his cough has improved since onset, but his cough persists. He has been taking Theraflu with some relief of his symptoms. He endorses a sick contact with his son who had symptoms several days prior to the patient. His son was not tested for COVID-19. Patient denies any shortness of breath, fever, body aches, or diarrhea.     Past Medical History:   Diagnosis Date   ??? Hypertension    ??? Migraines    ??? Neck pain, chronic        Patient Active Problem List   Diagnosis   ??? Chronic migraine without aura without status migrainosus, not intractable   ??? Hypertension   ??? Neck pain, chronic   ??? Enrolled in chronic care management   ??? Personal history of tobacco use, presenting hazards to health   ??? CKD (chronic kidney disease) stage 3, GFR 30-59 ml/min       No past surgical history on file.    No current facility-administered medications for this encounter.     Current Outpatient Medications:   ???  amLODIPine (NORVASC) 5 MG tablet, Take 1 tablet (5 mg total) by mouth daily., Disp: 90 tablet, Rfl: 3  ???  diphenhydrAMINE (BENADRYL) 25 mg capsule, Take 1 capsule (25 mg total) by mouth every four (4) hours as needed for itching (headache). (Patient not taking: Reported on 07/02/2019), Disp: 30 capsule, Rfl: 0  ???  galcanezumab-gnlm (EMGALITY)  120 mg/mL injection, Inject the contents of 1 pen (120 mg) under the skin every thirty (30) days., Disp: 1 mL, Rfl: 11  ???  ibuprofen (MOTRIN) 600 MG tablet, Take 1 tablet (600 mg total) by mouth every eight (8) hours as needed for pain (take as needed for Headache)., Disp: 90 tablet, Rfl: 1  ???  propranolol (INDERAL) 80 MG tablet, Take 1 tablet (80 mg total) by mouth Two (2) times a day., Disp: 180 tablet, Rfl: 3 ???  topiramate (TOPAMAX) 100 MG tablet, Take 1 tablet (100 mg total) by mouth Two (2) times a day., Disp: 180 tablet, Rfl: 3    Allergies  Patient has no known allergies.    Family History   Problem Relation Age of Onset   ??? Stroke Mother 29        hemorrhagic   ??? Hyperlipidemia Mother    ??? Hypertension Mother    ??? Migraines Mother    ??? No Known Problems Father    ??? No Known Problems Brother    ??? Migraines Son    ??? No Known Problems Maternal Grandmother    ??? No Known Problems Maternal Grandfather        Social History  Social History     Tobacco Use   ??? Smoking status: Current Every Day Smoker     Packs/day: 0.50     Years: 15.00     Pack years: 7.50     Types: Cigarettes     Start date: 06/01/1997   ??? Smokeless tobacco: Never Used   Substance Use Topics   ??? Alcohol use: No     Alcohol/week: 0.0 standard drinks   ??? Drug use: No       Review of Systems   Constitutional: Negative for fever.  Eyes: Negative for visual changes.  ENT: Negative for sore throat.  Cardiovascular: Negative for chest pain.  Respiratory: Positive for cough. Negative for shortness of breath.  Gastrointestinal: Negative for abdominal pain, vomiting or diarrhea.  Genitourinary: Negative for dysuria.   Musculoskeletal: Negative for back pain.  Skin: Negative for rash.  Neurological: Negative for headaches, focal weakness or numbness.       Physical Exam     This provider entered the patient's room: Yes:    ? If this provider did not enter the room, a comprehensive physical exam was not able to be performed due to increased infection risk to themselves, other providers, staff and other patients), as well as to conserve personal protective equipment (PPE) utilization during the COVID-19 pandemic.    ? If this provider did enter the patient room, the following was PPE worn: CAPR with face shield, gown and gloves      ED Triage Vitals [10/31/19 1130]   Enc Vitals Group      BP 123/79      Heart Rate 78      SpO2 Pulse       Resp 20 Temp 36.7 ??C (98 ??F)      Temp Source Oral      SpO2 97 %       Constitutional: Alert and oriented. Well appearing and in no distress.  Eyes: Conjunctivae are normal.  ENT       Head: Normocephalic and atraumatic.       Nose: No rhinorrhea.       Mouth/Throat: Mucous membranes appear moist.       Neck: No audible stridor.  Hematological/Lymphatic/Immunilogical: No cervical lymphadenopathy.  Cardiovascular: Blood pressure  and pulse rate as documented above. Normal rate, regular rhythm. Normal and symmetric distal pulses are present in all extremities.  Respiratory: Breath sounds are normal. Normal respiratory pattern and effort. No audible wheezing or other abnormal breath sounds. Speaking easily in full sentences. No cough noted during my evaluation of the patient.   Gastrointestinal: Soft and nontender. There is no CVA tenderness.  Musculoskeletal: Normal range of motion in all extremities.       Right lower leg: No tenderness or edema.       Left lower leg: No tenderness or edema.  Neurologic: Normal speech and language. No gross focal neurologic deficits are appreciated.  Skin: Skin appears warm and dry. No rash visible.  Psychiatric: Mood and affect are normal. Speech and behavior are normal.        Documentation assistance was provided by Angella Appah, Scribe on October 31, 2019 at 11:31 AM for Cheri Rous, FNP.      October 31, 2019 11:51 AM. Documentation assistance provided by the scribe. I was present during the time the encounter was recorded. The information recorded by the scribe was done at my direction and has been reviewed and validated by me.        Junious Dresser [place], [place]  10/31/19 1151

## 2019-11-06 DIAGNOSIS — I10 Essential (primary) hypertension: Principal | ICD-10-CM

## 2019-11-06 DIAGNOSIS — Z87891 Personal history of nicotine dependence: Principal | ICD-10-CM

## 2019-11-06 DIAGNOSIS — G8929 Other chronic pain: Principal | ICD-10-CM

## 2019-11-06 DIAGNOSIS — M542 Cervicalgia: Principal | ICD-10-CM

## 2019-11-06 NOTE — Unmapped (Signed)
The Internal Medicine CCM team was unable to reach [facility] to follow-up in regards to recent ED visit. and follow up. A voicemail was left with a request to return our call. This is our second attempt to contact patient.

## 2019-11-06 NOTE — Unmapped (Signed)
The Internal Medicine CCM team was unable to reach [facility] to follow-up in regards to recent ED, CCM Follow up. . A voicemail was left with a request to return our call. This is our first attempt to contact patient.      [facility] resource numbers and CCM POC mailed to pt.

## 2019-11-25 ENCOUNTER — Ambulatory Visit: Admit: 2019-11-25 | Discharge: 2019-11-26 | Payer: MEDICARE | Attending: Neurology | Primary: Neurology

## 2019-11-25 DIAGNOSIS — G43709 Chronic migraine without aura, not intractable, without status migrainosus: Principal | ICD-10-CM

## 2019-11-25 DIAGNOSIS — I10 Essential (primary) hypertension: Principal | ICD-10-CM

## 2019-11-25 DIAGNOSIS — G8929 Other chronic pain: Secondary | ICD-10-CM

## 2019-11-25 DIAGNOSIS — M542 Cervicalgia: Principal | ICD-10-CM

## 2019-11-25 MED ORDER — RIZATRIPTAN 10 MG DISINTEGRATING TABLET
ORAL_TABLET | Freq: Once | ORAL | 3 refills | 45.00000 days | Status: CP | PRN
Start: 2019-11-25 — End: 2020-11-24
  Filled 2019-11-28: qty 27, 45d supply, fill #0

## 2019-11-25 NOTE — Unmapped (Signed)
Please call the office to reschedule a visit for mutually convenient time.  I was unable to connect with you today using text message or telephone.      Wear a mask. Wait (6 feet apart- avoid close contact). Wash your Hands.    Regards,    Dr. Hollie Beach  Board Certified: Adult Neurology  [facility] Division of General Medicine and Clinical Epidemiology  [place], [place]      Helpful Contact Information :    ?? If you have a cough, shortness of breath or you are concerned that you may have COVID-19, please call [phone] [facility] Helpline)    ?? Appointments/ Internal Medicine Clinic: [phone]    ?? HIPPA Compliant Facsimile: [phone]  ?? After Hours:  [phone].  A nurse will answer and can help you decide what kind of medication attention you need.     For coronavirus updates, please check: [email or web address].com/    Thank you for choosing [employer].

## 2019-11-25 NOTE — Unmapped (Signed)
I was unable to reach patient via video link or telephone for his scheduled reevaluation for chronic migraine.      PLAN:     I have left a voicemail so that the patient can be scheduled for mutually convenient time

## 2019-11-25 NOTE — Unmapped (Signed)
Error - duplicate

## 2019-11-25 NOTE — Unmapped (Addendum)
For Emgality injection  We will contact you to schedule refills. Should you have a therapy change or need a refill early, please contact the pharmacy toll free at [phone] (option 4).    Your appointment tomorrow is with Dr Gerhard Munch.       Wear a mask. Wait (6 feet apart- avoid close contact). Wash your Hands. AVOID CROWDS!    For more information about the COVID Vaccine, please check [email or web address]  For coronavirus updates, please check: [email or web address].com/    Regards,    Dr. Hollie Beach  Board Certified: Adult Neurology  [facility] Division of General Medicine and Clinical Epidemiology  [place], [place]      Helpful Contact Information :    ?? If you have a cough, shortness of breath or you are concerned that you may have COVID-19, please call [phone] [facility] Helpline)    ?? Appointments/ Internal Medicine Clinic: [phone]    ?? HIPPA Compliant Facsimile: [phone]  ?? After Hours:  [phone].  A nurse will answer and can help you decide what kind of medication attention you need.         Thank you for choosing [facility].

## 2019-11-25 NOTE — Unmapped (Addendum)
ADDENDED: pt confirmed office visit w/ Dr. Hulan Fess 11/26/19 10:50am.      Pt requesting to come to [facility].  Missed telehealth Neurology visit this morning however- will possibly still  Have apt  w/ Dr. Rulon Eisenmenger later today.      Reason for Disposition  ??? MODERATE neck pain (e.g., interferes with normal activities like work or school)    Answer Assessment - Initial Assessment Questions  1. ONSET: When did the pain begin?       Neck pain going on over a year  2. LOCATION: Where does it hurt?       Left side, right side and back of neck.   3. PATTERN Does the pain come and go, or has it been constant since it started?       Comes and goes.   4. SEVERITY: How bad is the pain?  (Scale 1-10; or mild, moderate, severe)      - MODERATE 5-6/10 : interferes with normal activities or awakens from sleep     5. RADIATION: Does the pain go anywhere else, shoot into your arms?      NONE   6. CORD SYMPTOMS: Any weakness or numbness of the arms or legs?      NO Weakness   7. CAUSE: What do you think is causing the neck pain?      A lot of tension , PT says there is a lot of tension in my neck PT exercises are not working.   8. NECK OVERUSE: Any recent activities that involved turning or twisting the neck?     nothing that I can think of  9. OTHER SYMPTOMS: Do you have any other symptoms? (e.g., headache, fever, chest pain, difficulty breathing, neck swelling)      HEADACHE yes, 7/10. Right now no headache 0/10. Has had headaches for years, last one was a week ago. Missed neurology appt today.     Pt does have a BP cuff at home will start using. Taking BP meds, just not checking BP.    Protocols used: NECK PAIN OR STIFFNESS-ADULT-OH

## 2019-11-25 NOTE — Unmapped (Signed)
NEUROLOGY REPORT    [facility]  [facility] [place]  [address] [place] [address]  [phone]    Date: November 25, 2019  Patient Name: Glenn Kennedy  MRN: [id number]  PCP:  Kurtis Bushman  Time in with patient: 3:02 PM  Time out with patient: 3:15 PM    Prior visit with this Neurologist: April 24, 2019; 03/11/2019 ; January 21, 2019;    TELENEUROLOGY INFORMATION:  This visit is taking place in lieu of face-to-face visit, due to Covid-19 pandemic. This visit has taken place via face-to-face    I performed this clinical encounter by utilizing real time telehealth video connection between my location and the patients location.     I have identified myself to the patient and conveyed my credentials to him.    I have reviewed consent for this patient's visit, including the possibility of co-pay for this visit. I have explained the capabilities and limitations of telemedicine and Glenn Kennedy and I agree that it is appropriate for their current circumstances. The patient consents to this visit.     In case we get disconnected, patient's phone number is [phone] (home) .    Patient has signed informed consent on file in medical record.    Is there someone else in the room? Yes. What is your relationship? mother. Do you want this person here for the visit? yes.    I spent 13 minutes on the real-time audio and video with the patient. I spent an additional 5 minutes on pre- and post-visit activities.     The patient was physically located in [place] or a state in which I am permitted to provide care. The patient and/or parent/gauardian understood that s/he may incur co-pays and cost sharing, and agreed to the telemedicine visit. The visit was completed via phone and/or video, which was appropriate and reasonable under the circumstances given the patient's presentation at the time. The patient and/or parent/guardian has been advised of the potential risks and limitations of this mode of treatment (including, but not limited to, the absence of in-person examination) and has agreed to be treated using telemedicine. The patient's/patient's family's questions regarding telemedicine have been answered.     If the phone/video visit was completed in an ambulatory setting, the patient and/or parent/guardian has also been advised to contact their provider???s office for worsening conditions, and seek emergency medical treatment and/or call 911 if the patient deems either necessary.    Total Time spent  included: direct consultation reviewing details of history, examination and data findings, discussing my clinical reasoning and clinical impression, as well as a review of my recommendations for a plan of treatment as documented above. Additional time was provided to address alternative options for care, health literacy regarding the differential diagnosis, testing and medication options, the benefits of current plan as well as an opportunity to address all the patient's questions and concerns to their reported satisfaction. A written summary was provided to the patient at the time of the visit, as well as instructions on how to access My Chart. My contact information was provided along with instructions on how to reach the administrative office and the clinic.       Assessment and Plan/ Clinical Decision Making            Glenn Kennedy is a 39 y.o. male  has a past medical history of Hypertension, Migraines, and Neck pain, chronic. who presents for evaluation :  1. Chronic migraine without aura without status migrainosus, not intractable  Currently on prophylaxis with  Galcanezumab.   His mother dose not think it is working as well as he was doing before moving to [place]- he had been on Botox and switched to Emgality due to poor results but they would like to switch back to Botox.     PLAN: Continue Emgality until we are able to resume Botox (likely in 2-3 weeks).   Provided [facility].   Reviewed abortive Rx and provided refills- Rizatriptan plus NSAID. Not too much nausea, so no need for antiemetic at this time.     HEALTH EDUCATION/HEALTH LITERACY: PATIENT EDUCATION was provided. Reviewed the differential diagnosis, plan of care in detail, as well as other elements as detailed above. The benefits and risks of each of the above procedures, benefits and alternative options were reviewed with the patient.     Medication Management: There is a high risk for medication side effects, which will require ongoing monitoring and dose adjustment. Medication side effect profile was reviewed in detail with patient. Medication safety and drug interactions reviewed on ERx.     Patient Counselling: All the patient's questions and concerns were addressed to their stated satisfaction .      Barriers to Care: COVID 19 Pandemic. Low health literacy.      Return for Botox once we have it scheduled/planned. .               Subjective:          SOURCE: The history is obtained from the patient who appears to be a reliable historian. Additional history is obtained from review of available medical records. Open-ended and close-ended questions, as well as review of EPIC chart, including CareEverywhere if available.    History of Present Illness:     Glenn Kennedy is a 39 y.o.  male seen in followup for Migraine (Ongoing evaluation and management)    TODAYS VISIT 11/25/19  He feels that his headaches are back.   Constant, every day.    Present upon awakening.   Triggered by the sun/bright light. He is not eating breakfast and thinks that may be a trigger.     Abortive Rx:   Uses BC sparingly only after taking everything else.  Currently 1-2 per week.     Prophylaxis Rx:   Emgality- had it last month.   He would prefer to get Botox. His mother thinks he was doing better on the Botox. Helped better than the Emgality.       PRIOR VISIT April 24, 2019  We resumed Maxwell, since he had done really well on Emgality, with almost no headaches, until he moved to [facility] and did not receive it for 7-8 months while there. EMGALITY approved 01/18/2019 through 04/23/2019.    PRIOR VISIT April 2020  Headache:   Emgality: getting injections - has had 3 injections. Last injection about a week ago. No side effects.   Headaches every now and then- no more than weekly, typically 7/10 lasting   Triggers include the heat.   Also on Topiramate 100 MG BID- no sure of adherence.    Calendar- not sure how he is keeping it.     Neck Pain:     Continues to bother him. He feels that nothing helps his neck pain.   Does not recall PT.     Review of Systems: A 10-systems review was performed. Unless otherwise identified  here, in HPI or by patient  no additional symptoms identified.              Past Medical History:   Diagnosis Date   ??? Hypertension    ??? Migraines    ??? Neck pain, chronic      No past surgical history on file.  Family History   Problem Relation Age of Onset   ??? Stroke Mother 78        hemorrhagic   ??? Hyperlipidemia Mother    ??? Hypertension Mother    ??? Migraines Mother    ??? No Known Problems Father    ??? No Known Problems Brother    ??? Migraines Son    ??? No Known Problems Maternal Grandmother    ??? No Known Problems Maternal Grandfather        No Known Allergies  Current Outpatient Medications   Medication Sig Dispense Refill   ??? amLODIPine (NORVASC) 5 MG tablet Take 1 tablet (5 mg total) by mouth daily. 90 tablet 3   ??? diphenhydrAMINE (BENADRYL) 25 mg capsule Take 1 capsule (25 mg total) by mouth every four (4) hours as needed for itching (headache). (Patient not taking: Reported on 07/02/2019) 30 capsule 0   ??? galcanezumab-gnlm (EMGALITY) 120 mg/mL injection Inject the contents of 1 pen (120 mg) under the skin every thirty (30) days. 1 mL 11 ??? ibuprofen (MOTRIN) 600 MG tablet Take 1 tablet (600 mg total) by mouth every eight (8) hours as needed for pain (take as needed for Headache). 90 tablet 1   ??? propranolol (INDERAL) 80 MG tablet Take 1 tablet (80 mg total) by mouth Two (2) times a day. 180 tablet 3   ??? rizatriptan (MAXALT-MLT) 10 MG disintegrating tablet Take 1 tablet (10 mg total) by mouth once as needed for migraine. 27 tablet 3     No current facility-administered medications for this visit.        Social History     Socioeconomic History   ??? Marital status: Single     Spouse name: Not on file   ??? Number of children: Not on file   ??? Years of education: Not on file   ??? Highest education level: Not on file   Occupational History   ??? Not on file   Social Needs   ??? Financial resource strain: Not very hard   ??? Food insecurity     Worry: Not on file     Inability: Not on file   ??? Transportation needs     Medical: Yes     Non-medical: Yes   Tobacco Use   ??? Smoking status: Current Every Day Smoker     Packs/day: 0.50     Years: 15.00     Pack years: 7.50     Types: Cigarettes     Start date: 06/01/1997   ??? Smokeless tobacco: Never Used   Substance and Sexual Activity   ??? Alcohol use: No     Alcohol/week: 0.0 standard drinks   ??? Drug use: No   ??? Sexual activity: Not on file   Lifestyle   ??? Physical activity     Days per week: Not on file     Minutes per session: Not on file   ??? Stress: Not on file   Relationships   ??? Social Wellsite geologist on phone: Not on file     Gets together: Not on file     Attends  religious service: Not on file     Active member of club or organization: Not on file     Attends meetings of clubs or organizations: Not on file     Relationship status: Not on file   Other Topics Concern   ??? Not on file   Social History Narrative    12/24/18    Pt has moved back to Mom's house in [place].    Does not plan on going back to [place] anytime soon.        09/09/19 Pt lives w/ mom and 33 yo daughter.      Social History     Tobacco Use Smoking Status Current Every Day Smoker   ??? Packs/day: 0.50   ??? Years: 15.00   ??? Pack years: 7.50   ??? Types: Cigarettes   ??? Start date: 06/01/1997   Smokeless Tobacco Never Used       reports no history of alcohol use.       Objective:        GENERAL PHYSICAL EXAM:    Vital Signs: Not available for review due to Video Telehealth visit.   There were no vitals taken for this visit.  Estimated body mass index is 34.61 kg/m?? as calculated from the following:    Height as of 09/07/19: 170.2 cm (5' 7).    Weight as of 09/07/19: 100.2 kg (221 lb).  No height and weight on file for this encounter.    Wt Readings from Last 8 Encounters:   09/07/19 100.2 kg (221 lb)   07/02/19 74.8 kg (165 lb)   02/05/19 72.6 kg (160 lb)   01/18/19 88 kg (194 lb)   12/21/18 88.1 kg (194 lb 4.8 oz)   01/09/18 93.4 kg (206 lb)   12/13/17 94.8 kg (209 lb)   12/05/17 92.2 kg (203 lb 3.2 oz)        General Comments: The patient participated fully in the visit.     NEUROLOGICAL EXAMINATION   Mental Status  The patient is alert and oriented to time, place and person.   Attention/concentration adequate throughout the examination.   Speech and language were normal at the bedside.    Gait is normal  UE function is normal.        Diagnostic Studies and Review of Records:     DATA   I have independently reviewed the studies as noted.   1. Medical Records: reviewed   2. Brain Imaging: CT in 2015. Report reviewed.      EXAM: Computed tomography, head or brain without contrast material.     INTERPRETATION LOCATION: [facility]     DICTATED: Thursday, December 26, 2013 23:46:38     CLINICAL INDICATION: 39 year-old M. ?? - , HEADACHE RECURRENT OR KNOWN DX MIGRAINES, .     TECHNIQUE: Axial 4.8-mm images from skull base to vertex without contrast. For all [facility] CT exams, radiation dose reduction device (automated exposure control) is used or manual techniques with radiation dose As Low As Reasonably Achievable (ALARA) protocol are followed using age and patient-size-specific scan parameters, while maintaining the necessary diagnostic image quality.     COMPARISON: None.     FINDINGS: No evidence of intracranial bleed or acute infarct. No intracranial mass identified or midline shift. No fracture identified. The paranasal sinuses are pneumatized.     IMPRESSION:     No evidence of acute intracranial pathology.    Dr. Hollie Beach  CC: Kurtis Bushman,  MD  Dictated using voice-activated software. Despite editing, typographical errors may exist.

## 2019-11-26 ENCOUNTER — Ambulatory Visit: Admit: 2019-11-26 | Discharge: 2019-11-27 | Payer: MEDICARE

## 2019-11-26 DIAGNOSIS — G43709 Chronic migraine without aura, not intractable, without status migrainosus: Principal | ICD-10-CM

## 2019-11-26 DIAGNOSIS — M542 Cervicalgia: Secondary | ICD-10-CM

## 2019-11-26 DIAGNOSIS — I10 Essential (primary) hypertension: Principal | ICD-10-CM

## 2019-11-26 DIAGNOSIS — G8929 Other chronic pain: Principal | ICD-10-CM

## 2019-11-26 MED ORDER — CHANTIX STARTING MONTH BOX 0.5 MG (11)-1 MG (42) TABLETS IN DOSE PACK
PACK | 0 refills | 0 days | Status: CP
Start: 2019-11-26 — End: ?

## 2019-11-26 NOTE — Unmapped (Addendum)
Medicine Changes:  ?? Start varenicline (Chantix) when it arrives. This pill helps you stop smoking by reducing your cravings. Please review the instructions carefully because the numbers of pills you take each day changes! Take one 0.5mg  tab once daily for 3 days, then increase to one 0.5mg  tab twice daily for 4 days,then increase to one 1mg  tab twice daily.  ?? Look out for the new medication prescribed by your neurologist Dr. Jenita Seashore (rizatriptan) and take as written on bottle.    Things to do:  ?? Talk to your son about your smoking and helping keep you accountable to smoking 1 pack per day at most. The pill Chantix will reduce your cravings and make it a little easier, but it will still take your personal effort and support from your family. To be successful.    Follow-up:   ? Return in about 4 months (around 03/25/2020).     IMPORTANT NUMBERS  Care Manager  I???m having trouble with my health because of cost, my mood, trouble getting to clinic, or where I live. How can I get help?  ? Call the clinic [phone] and ask to speak with one of our social workers    Financial Counselor:   ? Call Ivor Costa [phone]  ? Can give you information about [facility], Pharmacy Assistance, and     Same Day Clinic   I'm sick today and need an appointment during office hours. Who do I call?  ?? Call [phone], ask for an appointment to be seen the same day at the clinic if I do not have any availability    After Hours, Weekend, or Holidays:  It's after 5:00pm during the week or it's the weekend. How do I get medical attention?  ?? Call the [facility] [phone] or toll free [phone] to get nurse advice.  ?? Go to [facility] walk-in clinic at [address], [place], [place]; [phone]; 7 days a week from 9:00AM - 8:00PM. ?? Go to [facility] at [facility] at [address] [place], [place]; [phone]; Mon-Fri 7:00AM-9:00PM, Sat-Sun 12:00PM-5:00PM  ?? Go to [facility] for sprains and strains, joint pain, sports injuries and possible fractures at [address], [place], [place]; [phone]; Mon-Thurs 8:00AM-7:00PM, Fri 8:00AM-5:00PM    How do I schedule an appointment or get a message to my doctor?  ? Call [phone] or   ? You may also schedule an appointment by using [facility] (patient portal).    How do I cancel or reschedule an appointment?  ?? Call [phone] or  ?? You may also cancel your appointment by using [facility] (patient portal).    How do I request medication refills?  ? Request a refill via [facility] (patient portal), call clinic at [phone] or have your pharmacy send an electronic request to [facility] in [place].      Best,   Dr. Claudia Pollock [facility]  [address], [place] [address], [address]  [place], [address]  Phone: [phone]  Toll Free: [phone]  Fax: [phone]    Nurse:  Milly Jakob  Administrative Assistant:  Lolita Cram

## 2019-11-26 NOTE — Unmapped (Signed)
I don't schedule for Rulon Eisenmenger that is Rosey Bath.

## 2019-11-26 NOTE — Unmapped (Signed)
Internal Medicine Clinic Visit    Reason for Visit:  Headaches, HTN, Smoking    A/P:    1. Chronic migraine without aura without status migrainosus, not intractable    2. Essential hypertension    3. Neck pain, chronic    4. Tobacco abuse      Chronic migraine w/o aura  Headaches are familiar to the patient without danger signs. Frequency of headaches is 1 or less a week, each lasting 1 day or less with mild-moderate severity. He is following closely with neurologist Dr. Jenita Seashore who is planning to switch prophylaxis from galcanezumab to Botox based on previous efficacy. He was prescribed abortive rizatriptan yesterday which has yet to arrive.  - Continuing following with neurology  - Continue taking ibuprofen 600mg  q8hr PRN  - Continue taking propranolol 80mg  BID    HTN  BP well controlled in clinic today at 118/72. Amlodipine is well tolerated with no edema, dizziness, lightheadedness.   - Continue taking amlodipine 5mg  daily    Neck pain  Continued neck pain which did not improve on previous tizanidine but adherence is unclear. Improve on PT therapy which patient described sounding like dry needling. Patient has another PT appointment on Thursday.    Tobacco abuse  Patient was receptive to motivational interviewing today. He wants to try Chantix and will try to reduce his smoking to 1-pack per day.  - Start Chantix   - Referral to [facility] tobacco treatment program  During today???s visit, I completed the following (choose corresponding billing codes from billing preference list):    ---Smoking cessation 3-10 minute: I spent 10 minutes counseling about smoking cessation (use diagnosis code [phone])        Medication adherence and barriers to the treatment plan have been addressed. Opportunities to optimize healthy behaviors have been discussed. Patient / caregiver voiced understanding.      Return in about 4 weeks (around 12/24/2019) for Next scheduled follow up. __________________________________________________________    HPI:    The patient is a 39 y.o. man with a history of chronic migraines and HTN who returns for follow-up for headaches, HTN, and neck pain.    The frequency of his headaches are moderately well controlled on current regimen of galcanezumab monthly injections. He reports having about 1 per week, which lasts 1 day or less. He says light is a trigger, but does not have light sensitivity during this milder events. He finds the current frequency and severity still bothersome.The last time he had a major migraine was 1.5 months prior when he had a 3 day headache with light sensitivity, and sought care at [facility] ED. He saw his neurologist Dr. Jenita Seashore yesterday via telephone.    He still has pain in his neck and it is unclear to him whether this is related to his headaches or separate. This was only relieved in the past by a procedure performed at a PT visit which was likely dry needling based on his description. He does not believe the exercise or stretches helped. He has an appointment this Thursday.    He had URI symptoms and sought care at [facility] ED on 10/31/2019, but reports this has resolved completely. He denies recent sick contacts, cough, fever, chills, fatigue.    Patient has been smoking 1-1.5 packs per day and is motivated to try to reduce the amount he smokes. He is motivated by his sense of personal health, the expense of smoking, and his son's voiced concerns. He believes he can  reduce his consumption to 1 pack per day by buying 2 packs every other day. He believes he can hold himself accountable by involving his son. He is receptive to trying varenicline.  __________________________________________________________    Problem List:  Patient Active Problem List   Diagnosis   ??? Chronic migraine without aura without status migrainosus, not intractable   ??? Hypertension   ??? Neck pain, chronic   ??? Enrolled in chronic care management ??? CKD (chronic kidney disease) stage 3, GFR 30-59 ml/min       Medications:  Reviewed in EPIC  __________________________________________________________    Physical Exam:   Vital Signs:  Vitals:    11/26/19 1041   BP: 118/72   Pulse: 68   Temp: 36.5 ??C (97.7 ??F)   TempSrc: Temporal   SpO2: 98%   Weight: 87.3 kg (192 lb 6.4 oz)   Height: 170.2 cm (5' 7)      Body mass index is 30.13 kg/m??.    General Appearance: Alert, NAD, appears well nourished Smells of cigarette smoke.   CV: normal rate, regular rhythm, no murmurs rubs or gallops.   Resp: CTAB, no increased WOB       Jiejing Zhang, MS-3    I have verified all student documentation or findings.  I have personally performed or re-performed the physical exam and medical decision making. Kurtis Bushman, MD

## 2019-11-26 NOTE — Unmapped (Signed)
ADDENDED: pt confirmed office visit w/ Dr. Hulan Fess 11/26/19 10:50am.      Pt requesting to come to [facility].  Missed telehealth Neurology visit this morning however- will possibly still  Have apt  w/ Dr. Rulon Eisenmenger later today.      Reason for Disposition  ??? MODERATE neck pain (e.g., interferes with normal activities like work or school)    Answer Assessment - Initial Assessment Questions  1. ONSET: When did the pain begin?       Neck pain going on over a year  2. LOCATION: Where does it hurt?       Left side, right side and back of neck.   3. PATTERN Does the pain come and go, or has it been constant since it started?       Comes and goes.   4. SEVERITY: How bad is the pain?  (Scale 1-10; or mild, moderate, severe)      - MODERATE 5-6/10 : interferes with normal activities or awakens from sleep     5. RADIATION: Does the pain go anywhere else, shoot into your arms?      NONE   6. CORD SYMPTOMS: Any weakness or numbness of the arms or legs?      NO Weakness   7. CAUSE: What do you think is causing the neck pain?      A lot of tension , PT says there is a lot of tension in my neck PT exercises are not working.   8. NECK OVERUSE: Any recent activities that involved turning or twisting the neck?     nothing that I can think of  9. OTHER SYMPTOMS: Do you have any other symptoms? (e.g., headache, fever, chest pain, difficulty breathing, neck swelling)      HEADACHE yes, 7/10. Right now no headache 0/10. Has had headaches for years, last one was a week ago. Missed neurology appt today.     Pt does have a BP cuff at home will start using. Taking BP meds, just not checking BP.    Protocols used: NECK PAIN OR STIFFNESS-ADULT-OH

## 2019-11-26 NOTE — Unmapped (Signed)
I was the supervising physician in the delivery of the service. Ademide Schaberg D Fallen Crisostomo, MD

## 2019-11-26 NOTE — Unmapped (Signed)
[facility] BPs  BP#1 121/71  p72   BP#2 116/71  p65  BP#3 117/75  p67    Average BP 118/72  p68  (please note this as a comment in vitals)

## 2019-11-27 MED FILL — EMGALITY PEN 120 MG/ML SUBCUTANEOUS PEN INJECTOR: 30 days supply | Qty: 1 | Fill #5 | Status: AC

## 2019-11-27 MED FILL — EMGALITY PEN 120 MG/ML SUBCUTANEOUS PEN INJECTOR: SUBCUTANEOUS | 30 days supply | Qty: 1 | Fill #5

## 2019-11-27 NOTE — Unmapped (Signed)
VM left for patient to return a call 1/6

## 2019-11-28 MED FILL — RIZATRIPTAN 10 MG DISINTEGRATING TABLET: 45 days supply | Qty: 27 | Fill #0 | Status: AC

## 2020-01-01 MED FILL — EMGALITY PEN 120 MG/ML SUBCUTANEOUS PEN INJECTOR: SUBCUTANEOUS | 30 days supply | Qty: 1 | Fill #6

## 2020-01-01 MED FILL — EMGALITY PEN 120 MG/ML SUBCUTANEOUS PEN INJECTOR: 30 days supply | Qty: 1 | Fill #6 | Status: AC

## 2020-01-09 MED ORDER — AMLODIPINE 5 MG TABLET
ORAL_TABLET | Freq: Every day | ORAL | 3 refills | 90 days | Status: CP
Start: 2020-01-09 — End: 2021-01-08

## 2020-01-30 NOTE — Unmapped (Signed)
Error

## 2020-02-03 MED FILL — EMGALITY PEN 120 MG/ML SUBCUTANEOUS PEN INJECTOR: 30 days supply | Qty: 1 | Fill #7 | Status: AC

## 2020-02-03 MED FILL — EMGALITY PEN 120 MG/ML SUBCUTANEOUS PEN INJECTOR: SUBCUTANEOUS | 30 days supply | Qty: 1 | Fill #7

## 2020-03-06 NOTE — Unmapped (Signed)
Contacted the Pt ([name]) who was referred to the Tobacco Treatment Program. Mr. Stearns declined services at this time. Confirmed that the Pt had TTP contact information and encouraged him to call should he change his mind.    Madaline Brilliant, NCTTP  Certified Tobacco Treatment Counselor   Telephone: [phone]

## 2020-03-09 MED FILL — EMGALITY PEN 120 MG/ML SUBCUTANEOUS PEN INJECTOR: 30 days supply | Qty: 1 | Fill #8 | Status: AC

## 2020-03-09 MED FILL — TIZANIDINE 2 MG TABLET: 10 days supply | Qty: 60 | Fill #2 | Status: AC

## 2020-03-09 MED FILL — EMGALITY PEN 120 MG/ML SUBCUTANEOUS PEN INJECTOR: SUBCUTANEOUS | 30 days supply | Qty: 1 | Fill #8

## 2020-03-09 MED FILL — TIZANIDINE 2 MG TABLET: ORAL | 10 days supply | Qty: 60 | Fill #2

## 2020-04-13 MED FILL — EMGALITY PEN 120 MG/ML SUBCUTANEOUS PEN INJECTOR: SUBCUTANEOUS | 30 days supply | Qty: 1 | Fill #9

## 2020-04-13 MED FILL — TIZANIDINE 2 MG TABLET: 10 days supply | Qty: 60 | Fill #3 | Status: AC

## 2020-04-13 MED FILL — EMGALITY PEN 120 MG/ML SUBCUTANEOUS PEN INJECTOR: 30 days supply | Qty: 1 | Fill #9 | Status: AC

## 2020-04-13 MED FILL — TIZANIDINE 2 MG TABLET: ORAL | 10 days supply | Qty: 60 | Fill #3

## 2020-05-08 DIAGNOSIS — G43709 Chronic migraine without aura, not intractable, without status migrainosus: Principal | ICD-10-CM

## 2020-05-08 MED ORDER — EMGALITY PEN 120 MG/ML SUBCUTANEOUS PEN INJECTOR
SUBCUTANEOUS | 11 refills | 30 days
Start: 2020-05-08 — End: 2021-05-08

## 2020-05-11 MED ORDER — EMGALITY PEN 120 MG/ML SUBCUTANEOUS PEN INJECTOR
SUBCUTANEOUS | 11 refills | 30.00000 days | Status: CP
Start: 2020-05-11 — End: 2021-05-11
  Filled 2020-05-13: qty 1, 30d supply, fill #0

## 2020-05-13 MED FILL — EMGALITY PEN 120 MG/ML SUBCUTANEOUS PEN INJECTOR: 30 days supply | Qty: 1 | Fill #0 | Status: AC

## 2020-05-22 NOTE — Unmapped (Signed)
Left voicemail for patient to call back to discuss scheduling appt for botox with Dr Rulon Eisenmenger.

## 2020-06-23 MED FILL — EMGALITY PEN 120 MG/ML SUBCUTANEOUS PEN INJECTOR: SUBCUTANEOUS | 30 days supply | Qty: 1 | Fill #1

## 2020-06-23 MED FILL — EMGALITY PEN 120 MG/ML SUBCUTANEOUS PEN INJECTOR: 30 days supply | Qty: 1 | Fill #1 | Status: AC

## 2020-07-17 MED ORDER — AMLODIPINE 5 MG TABLET
ORAL_TABLET | Freq: Every day | ORAL | 3 refills | 90.00000 days | Status: CP
Start: 2020-07-17 — End: 2021-07-17
  Filled 2020-07-20: qty 90, 90d supply, fill #0

## 2020-07-20 MED FILL — EMGALITY PEN 120 MG/ML SUBCUTANEOUS PEN INJECTOR: SUBCUTANEOUS | 30 days supply | Qty: 1 | Fill #2

## 2020-07-20 MED FILL — EMGALITY PEN 120 MG/ML SUBCUTANEOUS PEN INJECTOR: 30 days supply | Qty: 1 | Fill #2 | Status: AC

## 2020-07-20 MED FILL — AMLODIPINE 5 MG TABLET: 90 days supply | Qty: 90 | Fill #0 | Status: AC

## 2020-09-22 MED FILL — EMGALITY PEN 120 MG/ML SUBCUTANEOUS PEN INJECTOR: 30 days supply | Qty: 1 | Fill #3 | Status: AC

## 2020-09-22 MED FILL — EMGALITY PEN 120 MG/ML SUBCUTANEOUS PEN INJECTOR: SUBCUTANEOUS | 30 days supply | Qty: 1 | Fill #3

## 2020-12-28 MED FILL — EMGALITY PEN 120 MG/ML SUBCUTANEOUS PEN INJECTOR: SUBCUTANEOUS | 30 days supply | Qty: 1 | Fill #4

## 2020-12-28 MED FILL — AMLODIPINE 5 MG TABLET: ORAL | 90 days supply | Qty: 90 | Fill #1

## 2021-01-11 NOTE — Unmapped (Signed)
Outreached to patient to schedule visit to address the patient's problem list.       Abstraction Result Flowsheet Data    This patient's last AWV date: [facility] Last Medicare Wellness Visit Date: Not Found  This patients last WCC/CPE date: : Not Found      Reason for Encounter  Reason for Encounter: Outreach  Primary Reason for Call: Chronic Condition Refresh  Outreach Call Outcome: Unable to contact  Text Message: No

## 2021-02-21 ENCOUNTER — Ambulatory Visit
Admit: 2021-02-21 | Discharge: 2021-02-21 | Disposition: A | Discharge status: Home / Self Care | Payer: MEDICARE | Attending: Family

## 2021-02-21 DIAGNOSIS — M25511 Pain in right shoulder: Principal | ICD-10-CM

## 2021-02-21 DIAGNOSIS — M542 Cervicalgia: Principal | ICD-10-CM

## 2021-02-21 DIAGNOSIS — G8929 Other chronic pain: Principal | ICD-10-CM

## 2021-02-21 DIAGNOSIS — M25512 Pain in left shoulder: Principal | ICD-10-CM

## 2021-02-21 MED ORDER — LIDOCAINE 5 % TOPICAL PATCH
MEDICATED_PATCH | Freq: Every day | TRANSDERMAL | 0 refills | 14 days | Status: CP | PRN
Start: 2021-02-21 — End: 2021-03-07

## 2021-02-21 MED ORDER — METHOCARBAMOL 500 MG TABLET
ORAL_TABLET | Freq: Two times a day (BID) | ORAL | 0 refills | 10.00000 days | Status: CP | PRN
Start: 2021-02-21 — End: 2021-03-03

## 2021-02-21 MED ADMIN — methocarbamoL (ROBAXIN) tablet 500 mg: 500 mg | ORAL | @ 22:00:00 | Stop: 2021-02-21

## 2021-02-21 MED ADMIN — lidocaine (LIDODERM) 5 % patch 1 patch: 1 | TRANSDERMAL | @ 22:00:00 | Stop: 2021-02-21

## 2021-02-21 MED ADMIN — ketorolac (TORADOL) injection 15 mg: 15 mg | INTRAMUSCULAR | @ 22:00:00 | Stop: 2021-02-21

## 2021-02-21 NOTE — Unmapped (Signed)
Pt presents neck and bilateral shoulder pain for a while. Pt reports just can't get rid of achy

## 2021-02-22 DIAGNOSIS — I10 Essential (primary) hypertension: Principal | ICD-10-CM

## 2021-02-22 DIAGNOSIS — N183 Stage 3 chronic kidney disease, unspecified whether stage 3a or 3b CKD (CMS-HCC): Principal | ICD-10-CM

## 2021-02-22 NOTE — Unmapped (Signed)
I was the supervising physician in the delivery of the service. Dyland Panuco D Tamari Busic, MD
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
## 2021-02-22 NOTE — Unmapped (Signed)
CCM Follow-up Call    02/21/21 Pt in ED for back and neck pain 8/10    Pt Picking up Robaxin rx and lidocaine patches today. Will take tylenol prn  States HAs are doing better overall.   Will buy BP cuff to start monitoring BP  Will bring all meds to appt 03/01/21  Pt lives in [place]    PCP Action:None     CCM Action:follow up HTN, chronic pain, med refills Education and ETOH screen completed    Patient Action:pick up rxs given by ER. f/u appt [facility] 03/01/21.will call CCM and/or [facility] for any questions or concerns. states tramadol inj. @ ER helped a lot, but when woke up today pain still 8/10. - pt was encourgaed top  pick up muscle relaxant and lidocaine patches.       Follow up/Discuss next call: meds, BP, pain,CKD

## 2021-02-23 DIAGNOSIS — G43709 Chronic migraine without aura, not intractable, without status migrainosus: Principal | ICD-10-CM

## 2021-02-23 DIAGNOSIS — I10 Essential (primary) hypertension: Principal | ICD-10-CM

## 2021-02-23 DIAGNOSIS — M542 Cervicalgia: Principal | ICD-10-CM

## 2021-02-23 DIAGNOSIS — G8929 Other chronic pain: Principal | ICD-10-CM

## 2021-02-23 NOTE — Unmapped (Signed)
I was the supervising physician in the delivery of the service. Tyrese Ficek D Daemien Fronczak, MD

## 2021-02-23 NOTE — Unmapped (Signed)
CCM Intake Call  Pt requesting a call to pharmacy re: lidocaine patch PA  Pt states robaxin rx is helpful, feeling better. No c/o migraine today.    PCP Action:none     CCM Action:follow up pain mngt  spoke w/ pharmacy, pt requires PA for lidocaine patches,  Pt states will buy lidocaine patches OTC. For ~ $23    Patient Action:call CCM and.or [facility] w/ questions or concerns.       Follow up/Discuss next call:  Pain mngt, HTN , HA , meds

## 2021-03-01 ENCOUNTER — Ambulatory Visit
Admit: 2021-03-01 | Discharge: 2021-03-02 | Payer: MEDICARE | Attending: Student in an Organized Health Care Education/Training Program | Primary: Student in an Organized Health Care Education/Training Program

## 2021-03-01 DIAGNOSIS — M542 Cervicalgia: Principal | ICD-10-CM

## 2021-03-01 DIAGNOSIS — G8929 Other chronic pain: Principal | ICD-10-CM

## 2021-03-01 MED ORDER — METHOCARBAMOL 500 MG TABLET
ORAL_TABLET | Freq: Two times a day (BID) | ORAL | 0 refills | 15 days | Status: CP | PRN
Start: 2021-03-01 — End: 2021-03-16

## 2021-03-01 NOTE — Unmapped (Signed)
Internal Medicine Clinic Visit    Reason for visit: f/u neck pain    A/P:    Neck and shoulder pain: Acute on chronic. No warning signs. Not functionally limiting.  -- PT at [facility] already scheduled  -- continue tylenol, lidocaine patch, heating pad, massages  -- methocarbamoL (ROBAXIN) 500 MG tablet; Take 1 tablet (500 mg total) by mouth two (2) times a day as needed (pain) for up to 15 days.    Return in about 6 weeks (around 04/09/2021) for [place].    Staffed with Dr. Lucita Ferrara.  __________________________________________________________    HPI:  Evaluated in the ED for recurrent neck/shoulder pain a few days ago. He was given lidocaine patch and methocarbamol, which have been slightly helpful since.    This pain has been ongoing for several months and actually for years on chart review. He has been doing several conservative management techniques already. No new traumatic injury. Does not lift weights. Does not carry anything heavy for work. Pain is symmetric and without radiation. No pain over spine. No fevers.    PT has been helpful before for the same problem. He is currently scheduled for [facility] PT in early May.  __________________________________________________________    Problem List:  Patient Active Problem List   Diagnosis   ??? Chronic migraine without aura without status migrainosus, not intractable   ??? Hypertension   ??? Neck pain, chronic   ??? Enrolled in chronic care management   ??? CKD (chronic kidney disease) stage 3, GFR 30-59 ml/min (CMS-HCC)     Medications:  Reviewed in EPIC  __________________________________________________________    Physical Exam:   Vital Signs:  Vitals:    03/01/21 1021   BP: 133/84   Pulse: 79   Resp: 14   Temp: 36.8 ??C   SpO2: 98%   Weight: 89.7 kg (197 lb 12.8 oz)   Height: 179.1 cm (5' 10.5)     Gen: Well appearing, NAD  CV: RRR, no murmurs  Pulm: CTA bilaterally, no crackles or wheezes  Abd: Soft, NTND, normal BS.  MSK: no spinal TTP, diffuse trapezius and deltoid muscle TTP, full strength, full ROM

## 2021-03-01 NOTE — Unmapped (Signed)
[facility] at [facility]       Type of visit:  face to face    Reason for visit: neck and shoulder pain    General Consent to Treat (GCT) for non-epic video visits only: Verbal consent    Screening BP- 133/84    Allergies reviewed: Yes    Medication reviewed: Yes  Pended refills? No    HCDM reviewed and updated in Epic:    We are working to make sure all of our patients??? wishes are updated in Epic and part of that is documenting a Environmental health practitioner for each patient  A Health Care Decision Rodena Piety is someone you choose who can make health care decisions for you if you are not able ??? who would you most want to do this for you????  is already up to date.    BPAs completed:  PHQ2  HARK - Interpersonal Violence      COVID-19 Vaccine Summary  Which COVID-19 Vaccine was administered  Pfizer  Type:  Pfizer  Dates Given:  05/08/2020  Due Date for next dose:   05/29/2020    Immunization History   Administered Date(s) Administered   ??? COVID-19 VACC,MRNA,(PFIZER)(PF)(IM) 05/08/2020   ??? DTaP 05/06/2012   ??? INFLUENZA TIV (TRI) PF (IM) 10/31/2007   ??? Influenza Vaccine Quad (IIV4 PF) 63mo+ injectable 01/01/2016, 10/21/2016   ??? Influenza Virus Vaccine, unspecified formulation 08/19/2017, 10/20/2018   ??? PNEUMOCOCCAL POLYSACCHARIDE 23 01/01/2016   ??? TdaP 02/16/2017   __________________________________________________________________________________________    SCREENINGS COMPLETED IN FLOWSHEETS    HARK Screening  HARK Screening  Within the last year, have you been humiliated or emotionally abused in other ways by your partner or ex-partner?: No  Within the last year, have you been afraid of your partner or ex-partner?: No  Within the last year, have you been raped or forced to have any kind of sexual activity by your partner or ex-partner?: No  Within the last year, have you been kicked, hit, slapped, or otherwise physically hurt by your partner or ex-partner?: No    PHQ2  PHQ-2 Total Score : 0

## 2021-03-01 NOTE — Unmapped (Addendum)
Nice meeting you today. Sorry the neck pain is still bothering you. I think the biggest things are going to be continuing with the stretching, heating pad, and seeing PT at [facility]. Also continue the tylenol. I will refill the muscle relaxer medication as well to take as needed.

## 2021-03-01 NOTE — Unmapped (Signed)
Immediately after or during the visit, I reviewed with the resident the medical history and the resident’s findings on physical examination.  I discussed with the resident the patient’s diagnosis and concur with the treatment plan as documented in the resident note. Gabryela Kimbrell R Haifa Hatton, MD

## 2021-03-12 ENCOUNTER — Ambulatory Visit: Admit: 2021-03-12 | Discharge: 2021-03-13 | Discharge status: Against Medical Advice | Payer: MEDICARE

## 2021-03-13 NOTE — Unmapped (Signed)
HA x 2 days with photophobia. Denies N/V, fever or blurry vision. Hx of Migraines.

## 2021-03-13 NOTE — Unmapped (Signed)
Called for update again no answer. 2x

## 2021-03-13 NOTE — Unmapped (Signed)
Called again still no answer. 3 x

## 2021-03-13 NOTE — Unmapped (Signed)
Called for 2 hour vitals not answer.

## 2021-03-25 ENCOUNTER — Ambulatory Visit
Admit: 2021-03-25 | Discharge: 2021-04-23 | Payer: MEDICARE | Attending: Rehabilitative and Restorative Service Providers" | Primary: Rehabilitative and Restorative Service Providers"

## 2021-03-25 NOTE — Unmapped (Signed)
[facility]  OUTPATIENT PHYSICAL THERAPY  03/25/2021  Note Type: Evaluation       Patient Name: Glenn Kennedy  Date of Birth:[birth year]  Diagnosis:   Encounter Diagnoses   Name Primary?   ??? Chronic pain of both shoulders Yes   ??? Neck pain      Referring MD:  Orion Crook, FNP     Date of Onset of Impairment-No date available  Date PT Care Plan Established or Reviewed-No date available  Date PT Treatment Started-No date available   Plan of Care Effective Date:     Assessment/Plan:    Assessment details:       40 y.o. year old male presents with bilateral shoulder and neck pain which is limiting their ability to perform the functional activities listed below.  Based on patient's presentation in clinic today, signs and symptoms appear including impaired posture, limited cervical AROM, limited cervical joint mobility, tenderness to palpation, limited shoulder AROM, limited shoulder strength to be consistent with chronic neck pain with movement coordination deficits and bilateral scapular dysfunction.  This patient requires skilled physical therapy services to address the outlined impairments in order to return to their desired level of function.  Impairments: pain, joint restriction, impaired flexibility, decreased strength, decreased range of motion, decreased knowledge of self-care, gait deviation, impaired ADLs and poor awareness of body mechanics    Personal Factors/Comorbidities: 2  Specific Comorbidities: see EMR  Examination of Body Systems: 3 elements  Body System: MSK, neuro, and communication   Clinical Presentation: evolving  Clinical Decision Making: moderate  Prognosis: fair prognosis    Negative Prognosis Rationale: motivated for treatment, history of compliance, chronicity of condition, severity of symptoms and previous tx with benefit.    Therapy Goals  Goals:      Goals:??    Long-Term Goals   In 12 weeks 8  1. The pt will demonstrate independent performance of HEP to maintain functional gains.   2. Patient to report return to overhead reaching with pain </= 0/10 to demo improved tolerance to previous activities.  3. The patient will improve in Quick dash score to less than 30% demonstrate improvement in overall function.    4. Patient to report return to turning his head while driving without pain less than 2/10 to demonstrated improved tolerance to previous activities.     Plan  Therapy options: will be seen for skilled physical therapy services  Planned therapy interventions: manual therapy, body mechanics training, therapeutic activities, therapeutic exercises, postural training, neuromuscular re-education, education - patient and home exercise program    Frequency: 1x week  Duration in weeks: 8  Education provided to: patient.  Education provided: anatomy, body mechanics, importance of Therapy and HEP  Education results: needs reinforcement and needs further instruction.  Communication/Consultation: Discussed POC with PT.  Next visit plan:       Continue ROM and strengthening   Total Session Time: 45  Treatment rendered today:       See objective         Subjective:   History of Present Illness  Date of Onset: 2015.    Date of Evaluation: 03/25/2021  Surgical Procedure: TKR  Reason for Referral/Chief Complaint:       Neck pain and B shoulder pain   Subjective:     Pt is a 40 year old male who presents with a several year history of neck pain, headaches, and B shoulder pain. Pt notes that his pain started without  a specific event or injury. Pt notes that sitting, lifting, turning, and reaching overhead all increase his symptoms but he can also have pain other times just sitting or lying down. He reports that botox helps his symptoms improve but otherwise he has not found anything to help. Pt denies any numbness/tingling or red flag symptoms. Pt would like to be able to sit with less pain.   Quality of life: good    Pain  Current pain rating: 8  At best pain rating: 2  At worst pain rating: 8  Location: B shoulder, B cervical spine, and occpitals  Quality: aching and throbbing  Relieving factors: medications  Aggravating factors: performance of arm dominant activites, overhead activity, bending and lifting  Pain Related Behaviors: none  Progression: no change  Red flags: none.    Current functional status: limited household activities, limited sitting tolerance, limited standing tolerance, leisure activities and limited lifting    Precautions and Equipment  Precautions: None  Current Braces/Orthoses: None  Equipment Currently Used: None  Social Support  Lives in: [place]  Lives with: family members  Hand dominance: right  Communication Preference: verbal, written and visual  Barriers to Learning: No Barriers  Work/School: not working    Diagnostic Tests  X-ray: normal        Treatments  No previous or current treatments  Current treatment: physical therapy      Patient Goals  Patient goals for therapy: decreased pain and increased strength        Objective:   Objective    Functional Test/Outcome Measures:  SPADI/QuickDASH: 43%    Posture/Observations:   Sitting: rounded shoulders, forward head and increased thoracic kyphosis  Standing: forward flexed posture, rounded shoulders, forward head and increased thoracic kyphosis    Cervical ROM Screen:  Flex min loss, ext mod loss with pain, rotation 65 deg bilaterally, side bend mod loss bilaterally    Myotomes: WFL B    Dermatomes: WNL B    Special Tests/Clearing Screens:     Special Test Result   Neer Impingement Not tested   Painful Arc Negative   Empty can Not tested   Leanord Asal Negative   Pain provocation with resisted ER Negative   Drop arm test Not tested   ER Lag sign Not tested   Scapular assistance Not tested   Scapular Resisted Not tested   Speed's Not tested   Apprehension-relocation Not tested   Bicep load Not tested   O'Brien's Not tested   Lift Off Not tested   Bear hug Not tested     Palpation/Segmental Motion/Joint Play:   - TTP: bilateral upper trap, sub occipitals, and cervical paraspinals  - Mobility: limited mobility with CPA's C2-C4    Range of Motion/Flexibility:   Motion Right Left   Shoulder Flexion 150 150   Shoulder Abduction 150 150   Shoulder IR at 90     Functional IR [facility] WFL   Shoulder ER at 90     Functional ER [facility] WFL       Strength/MMT:   UE MMT   UE MMT Right Left   Shoulder elevation: 5/5 5/5   Shoulder IR: 5/5 5/5   Shoulder ER: 4/5 4/5   Shoulder abduction 4/5 4/5       Evaluation: 35 min   Therex: 10 min   Cervical retraction 2x10  Thoracic extension 5 sec hold 2x10   W's RTB 2x8  Shoulder rolls 2x8    Total treatment time:  45                                I attest that I have reviewed the above information.  Signed: Vertis Kelch, PT  03/25/2021 9:24 AM

## 2021-03-29 MED FILL — EMGALITY PEN 120 MG/ML SUBCUTANEOUS PEN INJECTOR: SUBCUTANEOUS | 30 days supply | Qty: 1 | Fill #5

## 2021-03-29 MED FILL — AMLODIPINE 5 MG TABLET: ORAL | 90 days supply | Qty: 90 | Fill #2

## 2021-04-20 NOTE — Unmapped (Signed)
[place] [facility]  OUTPATIENT PHYSICAL THERAPY  04/20/2021  Note Type: Treatment Note       Patient Name: Park Beck Fulwider  Date of Birth:15-Feb-1981  Diagnosis:   Encounter Diagnoses   Name Primary?   ??? Chronic pain of both shoulders Yes   ??? Neck pain    ??? Neck pain, chronic      Referring MD:  Roma Schanz,*     Visit #: 2    Date of Onset of Impairment-No date available  Date PT Care Plan Established or Reviewed-No date available  Date PT Treatment Started-No date available   Plan of Care Effective Date:     Assessment/Plan:    Assessment details:       40 y.o. year old male presents with bilateral shoulder and neck pain which is limiting their ability to perform the functional activities listed below. Pt tolerated today's treatment session well, he reported less than 3/10 at the end of the session. He demonstrated good control with exercise during the session but reported muscle fatigue with exercise.This patient requires skilled physical therapy services to address the outlined impairments in order to return to their desired level of function.  Impairments: pain, joint restriction, impaired flexibility, decreased strength, decreased range of motion, decreased knowledge of self-care, gait deviation, impaired ADLs and poor awareness of body mechanics    Personal Factors/Comorbidities: 2  Specific Comorbidities: see EMR  Examination of Body Systems: 3 elements  Body System: MSK, neuro, and communication   Clinical Presentation: evolving  Clinical Decision Making: moderate  Prognosis: fair prognosis    Negative Prognosis Rationale: motivated for treatment, history of compliance, chronicity of condition, severity of symptoms and previous tx with benefit.    Therapy Goals  Goals:      Goals:??    Long-Term Goals   In 12 weeks 8  1. The pt will demonstrate independent performance of HEP to maintain functional gains.   2. Patient to report return to overhead reaching with pain </= 0/10 to demo improved tolerance to previous activities.  3. The patient will improve in Quick dash score to less than 30% demonstrate improvement in overall function.    4. Patient to report return to turning his head while driving without pain less than 2/10 to demonstrated improved tolerance to previous activities.     Plan  Therapy options: will be seen for skilled physical therapy services  Planned therapy interventions: manual therapy, body mechanics training, therapeutic activities, therapeutic exercises, postural training, neuromuscular re-education, education - patient and home exercise program    Frequency: 1x week  Duration in weeks: 8  Education provided to: patient.  Education provided: anatomy, body mechanics, importance of Therapy and HEP  Education results: needs reinforcement and needs further instruction.  Communication/Consultation: Discussed POC with PT.  Next visit plan:       Continue ROM and strengthening   Total Session Time: 45  Treatment rendered today:       See objective         Subjective:   History of Present Illness  Date of Onset: 2015.    Date of Evaluation: 03/25/2021  Surgical Procedure: TKR  Reason for Referral/Chief Complaint:       Neck pain and B shoulder pain   Subjective:     Pt is a 40 year old male who presents with a several year history of neck pain, headaches, and B shoulder pain. Pt notes that he has been feeling good overall  but today the back of his head hurts.   Quality of life: good    Pain  Current pain rating: 7  At best pain rating: 2  At worst pain rating: 7  Location: B shoulder, B cervical spine, and occpitals  Quality: aching and throbbing  Relieving factors: medications  Aggravating factors: performance of arm dominant activites, overhead activity, bending and lifting  Pain Related Behaviors: none  Progression: no change  Red flags: none.    Current functional status: limited household activities, limited sitting tolerance, limited standing tolerance, leisure activities and limited lifting    Precautions and Equipment  Precautions: None  Current Braces/Orthoses: None  Equipment Currently Used: None  Social Support  Lives in: [place] house  Lives with: family members  Hand dominance: right  Communication Preference: verbal, written and visual  Barriers to Learning: No Barriers  Work/School: not working    Diagnostic Tests  X-ray: normal        Treatments  No previous or current treatments  Current treatment: physical therapy      Patient Goals  Patient goals for therapy: decreased pain and increased strength        Objective:   Objective    Functional Test/Outcome Measures:  SPADI/QuickDASH: 43%    Range of Motion/Flexibility:   Motion Right Left   Shoulder Flexion 150 150   Shoulder Abduction 150 150   Shoulder IR at 90     Functional IR WFL WFL   Shoulder ER at 90     Functional ER [facility] WFL     Treatment Rendered:    Therapeutic Exercise: 30 min  ?? Cervical retraction 2x10  ?? Supine cervical rotation 2x10 B  ?? Cervical SB isometric 5 sec hold 2x6 B  ?? Thoracic extension 5 sec hold 2x10  ?? Cervical extension stretch over towel roll x 5 min   Manual Therapy: 10 min  ?? STM sub occipitals and paraspinals  ?? Cervical distraction grade 3, 30 sec bouts x 3  ?? Pain post 3/10  Gait Training:   Therapeutic Activities:   Neuromuscular Reeducation:       Total Time: 40    HEP:  Cervical retraction 2x10  Thoracic extension 5 sec hold 2x10   W's RTB 2x8  Shoulder rolls 2x8    Total treatment time: 45                                I attest that I have reviewed the above information.  Signed: Vertis Kelch, PT  04/20/2021 10:15 AM

## 2021-05-25 DIAGNOSIS — G43709 Chronic migraine without aura, not intractable, without status migrainosus: Principal | ICD-10-CM

## 2021-05-25 MED ORDER — EMGALITY PEN 120 MG/ML SUBCUTANEOUS PEN INJECTOR
SUBCUTANEOUS | 1 refills | 30.00000 days | Status: CP
Start: 2021-05-25 — End: 2021-07-24
  Filled 2021-05-27: qty 1, 30d supply, fill #0

## 2021-05-25 NOTE — Unmapped (Signed)
Prescribed Emgality pen, 60-day supply.

## 2021-09-15 DIAGNOSIS — I10 Essential (primary) hypertension: Principal | ICD-10-CM

## 2021-09-15 DIAGNOSIS — M542 Cervicalgia: Principal | ICD-10-CM

## 2021-09-15 DIAGNOSIS — N183 Stage 3 chronic kidney disease, unspecified whether stage 3a or 3b CKD (CMS-HCC): Principal | ICD-10-CM

## 2021-09-15 DIAGNOSIS — G8929 Other chronic pain: Principal | ICD-10-CM

## 2021-09-15 NOTE — Unmapped (Signed)
His mother is another person to contact.  He had previously moved to [place] and then came back because his son didn't like it there.  I am not sure if he has moved again or not.  If we cannot contact him, I would unenroll from CCM.  I have not seen him in some time now.   I was the supervising physician in the delivery of the service. Kurtis Bushman, MD

## 2021-09-15 NOTE — Unmapped (Signed)
[facility] Internal Medicine   CHRONIC CARE MANAGEMENT OUTREACH ENCOUNTER           Date of Service:  09/15/2021      Service:  Care Coordination - phone  MyChart use by patient is active: yes    Post-outreach Action Items:  ??? Provider: No/none.  ??? CM: Follow up on health management.  ??? Patient: No/none.    Chronic Care Management (CCM) Outreach  ??? Purpose of outreach:  Received request from Ernest Haber, [place] to follow up on health management with this patient.   ??? Care Management Intern (CMI) completed the following:  o Reviewed chart  o Was unable to reach [facility] and left a voicemail requesting that he give [place] a call back.   o Attempted to contact the patient's mother, Humphrey Guerreiro, per Dr. Hulan Fess, but was unable to reach her or leave a voicemail  o Sent the patient a MyChart message in an attempt to reach for CCM    A copy of this Patient Outreach/CCM Encounter was sent to patient's Primary Care Provider     CCM Documentation  Time spent in direct care with patient and/or health care proxy via non-in-person encounter(s): 2  Time spent in indirect patient care and coordination: 10

## 2021-09-29 DIAGNOSIS — N183 Stage 3 chronic kidney disease, unspecified whether stage 3a or 3b CKD (CMS-HCC): Principal | ICD-10-CM

## 2021-09-29 DIAGNOSIS — I10 Essential (primary) hypertension: Principal | ICD-10-CM

## 2021-09-29 NOTE — Unmapped (Signed)
[facility] Internal Medicine   CHRONIC CARE MANAGEMENT OUTREACH ENCOUNTER           Date of Service:  09/29/2021      Service:  Care Coordination - phone  MyChart use by patient is active: yes    Post-outreach Action Items:  ??? Provider: No/none.  ??? CM: No/none.  ??? Patient: No/none.    Follow-up Next Call: N/A    Chronic Care Management (CCM) Outreach  ??? Purpose of outreach:  CMI received request from Ernest Haber, LCSWA to follow up on patient's CCM enrollment   ??? Care Management Intern (CMI) completed the following:  o Reviewed chart  ?? CMI attempted to contact Glenn Kennedy  ?? Left a voicemail requesting that the patient give [place] a call back regarding CCM enrollment.     A copy of this Patient Outreach/CCM Encounter was sent to patient's Primary Care Provider     CCM Documentation  Time spent in direct care with patient and/or health care proxy via non-in-person encounter(s): 2  Time spent in indirect patient care and coordination: 0

## 2021-09-29 NOTE — Unmapped (Signed)
Is he meeting un-enrollment criteria now?  I was the supervising physician in the delivery of the service. Kurtis Bushman, MD

## 2021-10-01 NOTE — Unmapped (Signed)
[facility] Internal Medicine   CHRONIC CARE MANAGEMENT OUTREACH ENCOUNTER           Date of Service:  09/30/2021      Service:  Care Coordination - mychart  MyChart use by patient is active: yes    Post-outreach Action Items:  ??? Provider: No/none.  ??? CM: No/none.  ??? Patient: Pt follow up regarding CCM engagement .    Chronic Care Management (CCM) Outreach  ??? Purpose of outreach:  Follow up on patient's CCM Enrollment    ??? Care Manager (CM) completed the following:  o Sent patient CCM Engagement letter due to several attempts to contact pt regarding CCM without response to outreach.    A copy of this Patient Outreach/CCM Encounter was sent to patient's Primary Care Provider     CCM Documentation  Time spent in direct care with patient and/or health care proxy via non-in-person encounter(s): 5 mins  Time spent in indirect patient care and coordination:0

## 2021-10-04 NOTE — Unmapped (Signed)
I was the supervising physician in the delivery of the service. Haniel Fix D Helen Winterhalter, MD

## 2021-10-19 DIAGNOSIS — I10 Essential (primary) hypertension: Principal | ICD-10-CM

## 2021-10-19 DIAGNOSIS — N183 Stage 3 chronic kidney disease, unspecified whether stage 3a or 3b CKD (CMS-HCC): Principal | ICD-10-CM

## 2021-10-19 NOTE — Unmapped (Signed)
[facility] Internal Medicine   CHRONIC CARE MANAGEMENT OUTREACH ENCOUNTER           Date of Service:  10/19/2021      Service:  Care Coordination - mychart  MyChart use by patient is active: yes    Chronic Care Management (CCM) Outreach  ??? Purpose of outreach:  CCM Unenrollment    ??? Care Manager (CM) completed the following:  o Reviewed chart  o Identified following:   - Pt has not responded to CCM outreach attempts over the past 60 days.  - CM sent pt CCM unenrollment letter via Mychart pt may re-enroll in CCM as long as he continues to meet criteria at anytime.      CCM Documentation  Time spent in direct care with patient and/or health care proxy via non-in-person encounter(s): 5  Time spent in indirect patient care and coordination:0

## 2021-10-22 NOTE — Unmapped (Signed)
I was the supervising physician in the delivery of the service. Kurtis Bushman, MD

## 2022-01-19 NOTE — Unmapped (Signed)
[facility] Internal Medicine   CHRONIC CARE MANAGEMENT OUTREACH ENCOUNTER           Date of Service:  01/19/2022      Service:  Care Coordination - General  MyChart use by patient is active: yes    Post-outreach Action Items:  ??? Provider: No/none.  ??? CM: No/none.  ??? Patient: Contact PCP office if pt wishes to re-engage in PCP treatment and CCM.    Chronic Care Management (CCM) Outreach  ??? Purpose of outreach:  CCM Unenrollment   ??? Care Manager (CM) completed the following:  o Completed CCM Unenrollment   o Patient is being unenrolled from chronic care management services with their provider at this time due to lack of engagement.    o Sent letter informing pt unenrollment would be effective at the end of December 2022 via Mychart letter.  o CM submitted unenrollment letter stating they will not receive additional chronic care management services after 11/20/2021 unless they are to sign a new consent.   o Pt has not been seen by PCP since 02/22/2021    No future appointments.    A copy of this Patient Outreach/CCM Encounter was sent to patient's Primary Care Provider

## 2022-01-27 MED FILL — EMGALITY PEN 120 MG/ML SUBCUTANEOUS PEN INJECTOR: SUBCUTANEOUS | 30 days supply | Qty: 1 | Fill #1

## 2022-02-22 ENCOUNTER — Other Ambulatory Visit: Payer: Self-pay

## 2022-02-22 ENCOUNTER — Ambulatory Visit
Admission: EM | Admit: 2022-02-22 | Discharge: 2022-02-22 | Disposition: A | Discharge status: Home / Self Care | Payer: Self-pay

## 2022-02-22 DIAGNOSIS — K047 Periapical abscess without sinus: Secondary | ICD-10-CM

## 2022-02-22 DIAGNOSIS — K0889 Other specified disorders of teeth and supporting structures: Secondary | ICD-10-CM

## 2022-02-22 MED ORDER — AMOXICILLIN-POT CLAVULANATE 875-125 MG PO TABS: 1.0000 | ORAL_TABLET | Freq: Two times a day (BID) | ORAL | 0 refills | Status: AC

## 2022-02-22 MED ORDER — HYDROCODONE-ACETAMINOPHEN 5-325 MG PO TABS: 1.0000 | ORAL_TABLET | Freq: Four times a day (QID) | ORAL | 0 refills | Status: AC | PRN

## 2022-02-22 NOTE — ED Triage Notes (Signed)
Pt c/o tooth ache along lower left jaw x3days. Pt states that it is his lower left molar that is in pain.  Pt states that he had an abscess but it burst earlier today.  ? ?Pt has a dentist appointment tomorrow and asks for something for his pain.  ?

## 2022-02-22 NOTE — Discharge Instructions (Addendum)
Take the Augmentin twice daily with food for 10 days for treatment of your dental abscess. ? ?Take 1 to 2 tablets of Norco every 6 hours as needed for severe pain. ? ?You can use over-the-counter Tylenol and ibuprofen according to the package instructions as needed for mild to moderate pain.  If you take Tylenol make sure that you do not take more than 4000 mg a day.  Be mindful that the prescription pain medicine you are receiving also has Tylenol in it. ? ?Keep your appointment with your dentist tomorrow as previously scheduled. ?

## 2022-02-22 NOTE — ED Provider Notes (Signed)
?[facility] ? ? ? ?CSN: [id number] ?Arrival date & time: 02/22/22  1227 ? ? ?  ? ?History   ?Chief Complaint ?Chief Complaint  ?Patient presents with  ? Dental Pain  ? ? ?HPI ?Perry Myers is a 41 y.o. male.  ? ?HPI ? ?41 year old male here for evaluation of dental complaints. ? ?Patient reports that he has been experiencing pain in the lower rear molar of the left mandible for the last 3 days.  He denies any fever.  He states he thinks it was an abscess on the gumline but it burst earlier today.  He does have old fillings in both of those were molars.  He has an appointment to see his dentist tomorrow but he is requesting pain medication to get him through the night. ? ?History reviewed. No pertinent past medical history. ? ?There are no problems to display for this patient. ? ? ?History reviewed. No pertinent surgical history. ? ? ? ? ?Home Medications   ? ?Prior to Admission medications   ?Medication Sig Start Date End Date Taking? Authorizing Provider  ?amLODipine (NORVASC) 5 MG tablet Take 1 tablet by mouth daily. 01/09/20  Yes [provider]  ?amoxicillin-clavulanate (AUGMENTIN) 875-125 MG tablet Take 1 tablet by mouth every 12 (twelve) hours for 10 days. 02/22/22 03/04/22 Yes Margarette Canada, NP  ?Galcanezumab-gnlm (EMGALITY) 120 MG/ML SOAJ Inject into the skin. 05/25/21 02/26/22 Yes [provider]  ?HYDROcodone-acetaminophen (NORCO/VICODIN) 5-325 MG tablet Take 1-2 tablets by mouth every 6 (six) hours as needed. 02/22/22  Yes Margarette Canada, NP  ?propranolol (INDERAL) 80 MG tablet Take by mouth. 12/21/18  Yes [provider]  ? ? ?Family History ?History reviewed. No pertinent family history. ? ?Social History ?Social History  ? ?Tobacco Use  ? Smoking status: Every Day  ?  Types: Cigarettes  ? Smokeless tobacco: Never  ?Vaping Use  ? Vaping Use: Never used  ?Substance Use Topics  ? Alcohol use: Never  ? Drug use: Never  ? ? ? ?Allergies   ?Patient has no allergy information on  record. ? ? ?Review of Systems ?Review of Systems  ?Constitutional:  Negative for fever.  ?HENT:  Positive for dental problem and facial swelling. Negative for trouble swallowing.   ?Hematological: Negative.   ?Psychiatric/Behavioral: Negative.    ? ? ?Physical Exam ?Triage Vital Signs ?ED Triage Vitals  ?Enc Vitals Group  ?   BP 02/22/22 1403 (!) 166/122  ?   Pulse Rate 02/22/22 1403 88  ?   Resp 02/22/22 1403 18  ?   Temp 02/22/22 1403 98.5 ?F (36.9 ?C)  ?   Temp Source 02/22/22 1403 Oral  ?   SpO2 02/22/22 1403 99 %  ?   Weight --   ?   Height 02/22/22 1402 5\' 7"  (1.702 m)  ?   Head Circumference --   ?   Peak Flow --   ?   Pain Score 02/22/22 1401 10  ?   Pain Loc --   ?   Pain Edu? --   ?   Excl. in [place]? --   ? ?No data found. ? ?Updated Vital Signs ?BP (!) 166/122 (BP Location: Left Arm)   Pulse 88   Temp 98.5 ?F (36.9 ?C) (Oral)   Resp 18   Ht 5\' 7"  (1.702 m)   SpO2 99%  ? ?Visual Acuity ?Right Eye Distance:   ?Left Eye Distance:   ?Bilateral Distance:   ? ?Right Eye Near:   ?  Left Eye Near:    ?Bilateral Near:    ? ?Physical Exam ?Vitals and nursing note reviewed.  ?Constitutional:   ?   Appearance: Normal appearance. He is not ill-appearing.  ?HENT:  ?   Head: Normocephalic and atraumatic.  ?   Mouth/Throat:  ?   Mouth: Mucous membranes are moist.  ?   Pharynx: Oropharyngeal exudate and posterior oropharyngeal erythema present.  ?Musculoskeletal:  ?   Cervical back: Normal range of motion and neck supple.  ?Lymphadenopathy:  ?   Cervical: No cervical adenopathy.  ?Skin: ?   General: Skin is warm and dry.  ?   Capillary Refill: Capillary refill takes less than 2 seconds.  ?   Findings: No erythema or rash.  ?Neurological:  ?   General: No focal deficit present.  ?   Mental Status: He is alert and oriented to person, place, and time.  ?Psychiatric:     ?   Mood and Affect: Mood normal.     ?   Behavior: Behavior normal.     ?   Thought Content: Thought content normal.     ?   Judgment: Judgment normal.   ? ? ? ?UC Treatments / Results  ?Labs ?(all labs ordered are listed, but only abnormal results are displayed) ?Labs Reviewed - No data to display ? ?EKG ? ? ?Radiology ?No results found. ? ?Procedures ?Procedures (including critical care time) ? ?Medications Ordered in UC ?Medications - No data to display ? ?Initial Impression / Assessment and Plan / UC Course  ?I have reviewed the triage vital signs and the nursing notes. ? ?Pertinent labs & imaging results that were available during my care of the patient were reviewed by me and considered in my medical decision making (see chart for details). ? ?Is a very pleasant, nontoxic-appearing 41 year old male here for evaluation of dental pain and abscess that is been present for the past 3 days.  On exam patient has a symmetrical face but he does state that he feels like the lower left side of his face is swollen.  The tooth that is in question given in problems is one of his left rear molars.  Both of his last lower left rear molars have old fillings in place.  There is erythema along the buccal gumline and there is a milky pus draining along the lingual gumline.  The tooth and gum tissue themselves are tender to palpation.  There is no submental or submandibular fullness appreciated on exam and no cervical of adenopathy.  Given the puslike discharge from around the tooth I do feel the patient has a dental abscess and I will discharge him home on Augmentin twice daily for 10 days.  I will also give him 6 tablets of hydrocodone to bridge him through the night into the can see his dentist in the morning.  PDMP was consulted and patient has no history of narcotic scripts. ? ? ?Final Clinical Impressions(s) / UC Diagnoses  ? ?Final diagnoses:  ?Pain, dental  ?Dental abscess  ? ? ? ?Discharge Instructions   ? ?  ?Take the Augmentin twice daily with food for 10 days for treatment of your dental abscess. ? ?Take 1 to 2 tablets of Norco every 6 hours as needed for severe  pain. ? ?You can use over-the-counter Tylenol and ibuprofen according to the package instructions as needed for mild to moderate pain.  If you take Tylenol make sure that you do not take more than 4000 mg  a day.  Be mindful that the prescription pain medicine you are receiving also has Tylenol in it. ? ?Keep your appointment with your dentist tomorrow as previously scheduled. ? ? ? ? ?ED Prescriptions   ? ? Medication Sig Dispense Auth. Provider  ? amoxicillin-clavulanate (AUGMENTIN) 875-125 MG tablet Take 1 tablet by mouth every 12 (twelve) hours for 10 days. 20 tablet Margarette Canada, NP  ? HYDROcodone-acetaminophen (NORCO/VICODIN) 5-325 MG tablet Take 1-2 tablets by mouth every 6 (six) hours as needed. 6 tablet Margarette Canada, NP  ? ?  ? ?I have reviewed the PDMP during this encounter. ?  ?Margarette Canada, NP ?02/22/22 1454 ? ?

## 2022-05-09 NOTE — Unmapped (Signed)
Abstraction Result Flowsheet Data    This patient's last AWV date: [facility] Last Medicare Wellness Visit Date: Not Found  This patients last WCC/CPE date: : Not Found      Reason for Encounter  Reason for Encounter: Outreach  Primary Reason for Outreach: CCOO+  Text Message: Yes Dwana Curd)  Outreach Call Outcome: Left message                                      Outreached to patient to schedule visit to address the patient's problem list.  Left Message.

## 2022-10-22 ENCOUNTER — Ambulatory Visit
Admit: 2022-10-22 | Discharge: 2022-10-22 | Disposition: A | Discharge status: Home / Self Care | Payer: MEDICARE | Attending: Student in an Organized Health Care Education/Training Program

## 2022-10-22 DIAGNOSIS — M542 Cervicalgia: Principal | ICD-10-CM

## 2022-10-22 DIAGNOSIS — G43709 Chronic migraine without aura, not intractable, without status migrainosus: Principal | ICD-10-CM

## 2022-10-22 DIAGNOSIS — R519 Headache, unspecified headache type: Principal | ICD-10-CM

## 2022-10-22 DIAGNOSIS — I10 Essential (primary) hypertension: Principal | ICD-10-CM

## 2022-10-22 DIAGNOSIS — G8929 Other chronic pain: Principal | ICD-10-CM

## 2022-10-22 LAB — CBC W/ AUTO DIFF
BASOPHILS ABSOLUTE COUNT: 0.1 10*9/L (ref 0.0–0.1)
BASOPHILS RELATIVE PERCENT: 0.7 %
EOSINOPHILS ABSOLUTE COUNT: 0.2 10*9/L (ref 0.0–0.5)
EOSINOPHILS RELATIVE PERCENT: 1.4 %
HEMATOCRIT: 42.6 % (ref 39.0–48.0)
HEMOGLOBIN: 14.6 g/dL (ref 12.9–16.5)
LYMPHOCYTES ABSOLUTE COUNT: 2.6 10*9/L (ref 1.1–3.6)
LYMPHOCYTES RELATIVE PERCENT: 23.9 %
MEAN CORPUSCULAR HEMOGLOBIN CONC: 34.2 g/dL (ref 32.0–36.0)
MEAN CORPUSCULAR HEMOGLOBIN: 31.2 pg (ref 25.9–32.4)
MEAN CORPUSCULAR VOLUME: 91.1 fL (ref 77.6–95.7)
MEAN PLATELET VOLUME: 8.3 fL (ref 6.8–10.7)
MONOCYTES ABSOLUTE COUNT: 0.6 10*9/L (ref 0.3–0.8)
MONOCYTES RELATIVE PERCENT: 5.6 %
NEUTROPHILS ABSOLUTE COUNT: 7.4 10*9/L (ref 1.8–7.8)
NEUTROPHILS RELATIVE PERCENT: 68.4 %
NUCLEATED RED BLOOD CELLS: 0 /100{WBCs} (ref <=4)
PLATELET COUNT: 191 10*9/L (ref 150–450)
RED BLOOD CELL COUNT: 4.67 10*12/L (ref 4.26–5.60)
RED CELL DISTRIBUTION WIDTH: 13 % (ref 12.2–15.2)
WBC ADJUSTED: 10.8 10*9/L (ref 3.6–11.2)

## 2022-10-22 LAB — COMPREHENSIVE METABOLIC PANEL
ALBUMIN: 4.2 g/dL (ref 3.4–5.0)
ALKALINE PHOSPHATASE: 133 U/L — ABNORMAL HIGH (ref 46–116)
ALT (SGPT): 15 U/L (ref 10–49)
ANION GAP: 7 mmol/L (ref 5–14)
AST (SGOT): 19 U/L (ref <=34)
BILIRUBIN TOTAL: 0.5 mg/dL (ref 0.3–1.2)
BLOOD UREA NITROGEN: 14 mg/dL (ref 9–23)
BUN / CREAT RATIO: 14
CALCIUM: 9.3 mg/dL (ref 8.7–10.4)
CHLORIDE: 112 mmol/L — ABNORMAL HIGH (ref 98–107)
CO2: 22.6 mmol/L (ref 20.0–31.0)
CREATININE: 0.99 mg/dL
EGFR CKD-EPI (2021) MALE: >90 mL/min/{1.73_m2} (ref >=60)
GLUCOSE RANDOM: 109 mg/dL (ref 70–179)
POTASSIUM: 4 mmol/L (ref 3.4–4.8)
PROTEIN TOTAL: 7.5 g/dL (ref 5.7–8.2)
SODIUM: 142 mmol/L (ref 135–145)

## 2022-10-22 LAB — HIGH SENSITIVITY TROPONIN I - SINGLE: HIGH SENSITIVITY TROPONIN I: <3 ng/L (ref <=53)

## 2022-10-22 MED ORDER — AMLODIPINE 5 MG TABLET
ORAL_TABLET | Freq: Every day | ORAL | 0 refills | 30 days | Status: CP
Start: 2022-10-22 — End: 2022-11-21

## 2022-10-22 MED ORDER — PROPRANOLOL 80 MG TABLET
ORAL_TABLET | Freq: Two times a day (BID) | ORAL | 0 refills | 30 days | Status: CP
Start: 2022-10-22 — End: 2022-11-21

## 2022-10-22 MED ADMIN — ondansetron (ZOFRAN) injection 4 mg: 4 mg | INTRAVENOUS | @ 17:00:00 | Stop: 2022-10-22

## 2022-10-22 MED ADMIN — sumatriptan (IMITREX) injection 6 mg: 6 mg | SUBCUTANEOUS | @ 17:00:00 | Stop: 2022-10-22

## 2022-10-22 MED ADMIN — ketorolac (TORADOL) injection 15 mg: 15 mg | INTRAVENOUS | @ 17:00:00 | Stop: 2022-10-22

## 2022-10-22 MED ADMIN — diphenhydrAMINE (BENADRYL) injection: 25 mg | INTRAVENOUS | @ 17:00:00 | Stop: 2022-10-22

## 2022-10-22 MED ADMIN — sodium chloride 0.9% (NS) bolus 750 mL: 750 mL | INTRAVENOUS | @ 16:00:00 | Stop: 2022-10-22

## 2022-10-22 NOTE — Unmapped (Signed)
[facility]  Emergency Department Provider Note      ED Clinical Impression      Final diagnoses:   Headache, unspecified headache type (Primary)   Hypertension, unspecified type            Impression, Medical Decision Making, Progress Notes and Critical Care      Impression, Differential Diagnosis and Plan of Care    Patient is a 41 y.o. male with PMH of chronic neck pain, HTN, and migraines presenting for acute onset of posterior headache with associated photosensitivity yesterday consistent with his migraine headaches in the past.     On exam, the patient is well appearing and in no acute distress. Patient's vital signs showing normal blood pressure, heart rate within normal limits, afebrile, saturating 100% on room air with no signs of any respiratory distress. Physical exam reveled no concerning neurological symptoms.  Patient also has no signs or symptoms consistent with a viral respiratory illness.  Patient does have notable hypertension however has not been taking his blood pressure medications.    Differential includes migraine headache vs. hypertensive urgency versus viral illness.    Plan for EKG, troponin, UA w/ reflex, CBC and CMP. Will give migraine cocktail.    Patient had nonconcerning laboratory work-up and had resolution of his headache with administration of the migraine cocktail provided.  Patient was discharged with refills for his amlodipine and propanolol and advised on follow-up with his primary care to have further refills placed for these medications as well as further evaluation of his hypertension.    Discussion of Management with other Providers or Support Staff    I discussed the management of this patient with the:  Admitting provider: N/A  Consultant(s): N/A  Radiologist: N/A  ED Pharmacist: N/A  Case Management/Social Work: N/A  Other: N/A        Portions of this record have been created using Scientist, clinical (histocompatibility and immunogenetics). Dictation errors have been sought, but may not have been identified and corrected.    See chart and resident provider documentation for details.    ____________________________________________         History        Reason for Visit  Headache New Onset or New Symptoms        HPI   Glenn Kennedy is a 41 y.o. male with a past medical history of chronic neck pain, HTN and migraines who presents to the ED for evaluation of a headache. The patient report acute onset of posterior headache yesterday unrelieved by Excedrin or BC powder. His headache is consistent with his migraines in the past. He additionally reports photosensitivity. Of note, he has not been taking his normal blood pressure medication. Denies fever, chills, cough, congestion, sore throat, nausea or emesis.      External Records Reviewed  (Inpatient/Outpatient notes, Prior labs/imaging studies, Care Everywhere, PDMP, External ED notes, etc)    N/A    Past Medical History:   Diagnosis Date    Hypertension     Migraines     Neck pain, chronic        Patient Active Problem List   Diagnosis    Chronic migraine without aura without status migrainosus, not intractable    Hypertension    Neck pain, chronic    CKD (chronic kidney disease) stage 3, GFR 30-59 ml/min (CMS-HCC)       No past surgical history on file.    No current facility-administered medications for this encounter.  Current Outpatient Medications:     amLODIPine (NORVASC) 5 MG tablet, Take 1 tablet (5 mg total) by mouth daily., Disp: 90 tablet, Rfl: 3    amlodipine (NORVASC) 5 MG tablet, Take 1 tablet (5 mg total) by mouth daily., Disp: 30 tablet, Rfl: 0    galcanezumab-gnlm (EMGALITY PEN) 120 mg/mL injection, Inject the contents of 1 pen (120 mg) under the skin every thirty (30) days., Disp: 1 mL, Rfl: 1    propranoloL (INDERAL) 80 MG tablet, Take 1 tablet (80 mg total) by mouth two (2) times a day., Disp: 60 tablet, Rfl: 0    Allergies  Patient has no known allergies.    Family History   Problem Relation Age of Onset    Stroke Mother 32        hemorrhagic    Hyperlipidemia Mother     Hypertension Mother     Migraines Mother     No Known Problems Father     No Known Problems Brother     Migraines Son     No Known Problems Maternal Grandmother     No Known Problems Maternal Grandfather        Social History  Social History     Tobacco Use    Smoking status: Every Day     Current packs/day: 0.50     Average packs/day: 0.5 packs/day for 25.4 years (12.7 ttl pk-yrs)     Types: Cigarettes     Start date: 06/01/1997    Smokeless tobacco: Never   Substance Use Topics    Alcohol use: No     Alcohol/week: 0.0 standard drinks of alcohol    Drug use: No          Physical Exam     ED Triage Vitals   Enc Vitals Group      BP 10/22/22 1025 161/111      Heart Rate 10/22/22 1025 86      SpO2 Pulse --       Resp 10/22/22 1025 18      Temp 10/22/22 1025 36.9 ??C (98.4 ??F)      Temp Source 10/22/22 1025 Oral      SpO2 10/22/22 1025 100 %      Weight 10/22/22 1027 90.7 kg (200 lb)     Constitutional: Alert and oriented. Well appearing and in no distress.  Eyes: Conjunctivae are normal.  ENT       Head: Normocephalic and atraumatic.       Nose: No congestion.       Mouth/Throat: Mucous membranes are moist.       Neck: No stridor.  Hematological/Lymphatic/Immunilogical: No cervical lymphadenopathy.  Cardiovascular: Normal rate, regular rhythm. Normal and symmetric distal pulses are present in all extremities.  Respiratory: Normal respiratory effort. Breath sounds are normal.  Gastrointestinal: Soft and nontender. There is no CVA tenderness.  Genitourinary: deferred  Musculoskeletal: Normal range of motion in all extremities.  Neurologic: Normal speech and language. No gross focal neurologic deficits are appreciated.  Skin: Skin is warm, dry and intact. No rash noted.  Psychiatric: Mood and affect are normal. Speech and behavior are normal.        Documentation assistance was provided by Desma Paganini, Scribe, on October 22, 2022 at 11:08 AM for Huel Coventry, PA.      October 24, 2022 1:04 PM. Documentation assistance provided by the scribe. I was present during the time the encounter was recorded. The information recorded by the scribe was done  at my direction and has been reviewed and validated by me.               Wallene Huh, [place]  10/24/22 1304

## 2022-10-22 NOTE — Unmapped (Addendum)
Patient presents here with c/o headache since yesterday. H/o migraine. Denies any N/V. Reports photosensitivity. Denies any fever or chills. H/o HTN- not taking his meds . Denies any chest pain

## 2022-11-04 ENCOUNTER — Ambulatory Visit: Admit: 2022-11-04 | Payer: MEDICARE

## 2022-11-21 ENCOUNTER — Ambulatory Visit: Admit: 2022-11-21 | Discharge: 2022-11-21 | Disposition: A | Discharge status: Home / Self Care | Payer: MEDICARE

## 2022-11-21 DIAGNOSIS — G43909 Migraine, unspecified, not intractable, without status migrainosus: Principal | ICD-10-CM

## 2022-11-21 MED ADMIN — diphenhydrAMINE (BENADRYL) injection 25 mg: 25 mg | INTRAVENOUS | @ 21:00:00 | Stop: 2022-11-21

## 2022-11-21 MED ADMIN — ketorolac (TORADOL) injection 15 mg: 15 mg | INTRAVENOUS | @ 21:00:00 | Stop: 2022-11-21

## 2022-11-21 MED ADMIN — sodium chloride 0.9% (NS) bolus 1,000 mL: 1000 mL | INTRAVENOUS | @ 21:00:00 | Stop: 2022-11-21

## 2022-11-21 MED ADMIN — prochlorperazine (COMPAZINE) injection 10 mg: 10 mg | INTRAVENOUS | @ 21:00:00 | Stop: 2022-11-21

## 2022-11-21 NOTE — Unmapped (Signed)
Patient Name: Glenn Kennedy  MRN: [id number]  DOB: 05/14/1981  Primary Care Provider: Roma Schanz, MD  Date of Service: 11/21/2022  Location: [facility] EMERGENCY DEPARTMENT  History Taken From: The patient, review of available medical records, nursing notes.      Chief Complaint: Migraine      HISTORY OF PRESENT ILLNESS:  Glenn Kennedy is a 42 y.o. male evaluated for a headache.  He reports onset of a posterior headache beginning yesterday.  This is associated with photophobia and feels similar to prior migraines.  This is not the worst headache of his life but continues today so he comes in for help.  Yesterday he took some BC powder and tylenol but it did not abort the headache.  He denies any other symptoms associated with the headache.        PAST MEDICAL HISTORY:  Active Ambulatory Problems     Diagnosis Date Noted    Chronic migraine without aura without status migrainosus, not intractable 08/27/2014    Hypertension 08/25/2016    Neck pain, chronic 09/26/2016    CKD (chronic kidney disease) stage 3, GFR 30-59 ml/min (CMS-HCC) 12/21/2018     Resolved Ambulatory Problems     Diagnosis Date Noted    AKI (acute kidney injury) (CMS-HCC) 12/04/2017    Abscess of submandibular region, right 12/04/2017    Enrolled in chronic care management 06/15/2018    Personal history of tobacco use, presenting hazards to health 12/21/2018     Past Medical History:   Diagnosis Date    Migraines        PAST SURGICAL HISTORY:  No past surgical history on file.    CURRENT MEDICATIONS:  Patient's Medications   New Prescriptions    No medications on file   Previous Medications    AMLODIPINE (NORVASC) 5 MG TABLET    Take 1 tablet (5 mg total) by mouth daily.    AMLODIPINE (NORVASC) 5 MG TABLET    Take 1 tablet (5 mg total) by mouth daily.    GALCANEZUMAB-GNLM (EMGALITY PEN) 120 MG/ML INJECTION    Inject the contents of 1 pen (120 mg) under the skin every thirty (30) days.    PROPRANOLOL (INDERAL) 80 MG TABLET Take 1 tablet (80 mg total) by mouth two (2) times a day.   Modified Medications    No medications on file   Discontinued Medications    No medications on file       ALLERGIES:  No Known Allergies    SOCIAL HISTORY:   reports that he has been smoking cigarettes. He started smoking about 25 years ago. He has a 12.7 pack-year smoking history. He has never used smokeless tobacco. He reports that he does not drink alcohol and does not use drugs.    REVIEW OF SYSTEMS  Except as recorded above in the HPI, all other systems were reviewed and were negative.    PHYSICAL EXAMINATION  Vital Signs: BP 151/108  - Pulse 73  - Temp 36.7 ??C (98 ??F)  - Resp 16  - SpO2 99%     Constitutional: Alert, nontoxic.  Head: Normocephalic, atraumatic  Eyes: No scleral icterus.  Nose: No rhinorrhea.  Mouth: Oropharynx moist.  Neck: Nontender, Supple. No evidence of meningismus.  Lymphatic: No pathologic adenopathy noted.  Skin: No petechiae nor purpura.  Musculoskeletal: No swelling or erythema of major joints noted.  Neurologic:  Mental status:  Alert, fully oriented  Attention and Concentration: Normal  Language: No aphasia  Cranial nerves:  II: Visual field full  III,IV,VI: PERRL, EOMI, no ptosis  V: Symmetric muscles of mastication/jaw opening  VII Symmetric and lively facies, no labial dysarthria  VIII: Hearing intact   IX X: Palate elevates symmetrically. No hypophonia.  XI: Shoulder shrug 5/5 bilaterally  XII: Tongue protrudes midline without atrophy or fasciculation, no lingual dysarthria.  Motor: Strength: 5/5 bilateral upper and lower extremities  Tone: normal  Bulk: normal  Sensory: No gross sensory deficits to light touch  Coordination: normal RAM bilateral upper extremities    Psychiatric: Normal affect, and mood. Appears competent to understand and make decisions.    DIAGNOSTIC IMAGING:  No orders to display       MEDICAL DECISION MAKING AND CLINICAL COURSE:  The differential diagnosis of headache is broad and complex and includes structural etiologies such as tumor and hydrocephalus? inflammatory etiologies such as meningitis,or encephalitis?as well as vascular causes such as subdural, subarachnoid, and intraparenchymal hemorrhage.    Medications   sodium chloride 0.9% (NS) bolus 1,000 mL (1,000 mL Intravenous New Bag 11/21/22 1530)   ketorolac (TORADOL) injection 15 mg (15 mg Intravenous Given 11/21/22 1531)   prochlorperazine (COMPAZINE) injection 10 mg (10 mg Intravenous Given 11/21/22 1532)   diphenhydrAMINE (BENADRYL) injection 25 mg (25 mg Intravenous Given 11/21/22 1532)       This headache feels characteristic for a migraine to the patient.  His exam is reassuring.  Will administer a Migraine cocktail and reassess.    At approximately 4:12 PM  the patient was reexamined and the headache was significantly improved. Repeated neurological examination found no significant deficits.    After careful evaluation, no immediate life threatening causes for the headache were identified and I feel that this patient may be safely treated and evaluated as an outpatient. It was specifically discussed that should the headache change or worsen or should new symptoms develop, they should return to the ED immediately for additional evaluation. They seemed to understand these instructions.          IMPRESSION:  1. Migraine without status migrainosus, not intractable, unspecified migraine type          New Prescriptions    No medications on file       [facility] Emergency Department  [address]  [place] [address]  [phone]    If symptoms worsen    Gladman, Wilhemena Durie, MD  [address]  [place] [address]  [place] [address]  [phone]      As needed      Signed:    Astrid Drafts, PA       Helyn App, [place]  11/21/22 [phone]

## 2022-11-21 NOTE — Unmapped (Signed)
Pt c/o migraine since yesterday with photophobia. Pt has a hx of migraines, states he took a b/c powder today with no relief.

## 2022-11-21 NOTE — Unmapped (Signed)
Pt reports headache and sensitivity to light from yesterday. H/o migraine

## 2022-12-01 ENCOUNTER — Ambulatory Visit: Admit: 2022-12-01 | Discharge: 2022-12-01 | Discharge status: Against Medical Advice | Payer: MEDICARE

## 2022-12-01 NOTE — Unmapped (Signed)
Pt ambulatory to triage states posterior headache began last night. Denies injury to head. Endorses light sensitivity and throbbing to the back of head. Hx migraines. States feels about same as prior.

## 2022-12-02 ENCOUNTER — Ambulatory Visit
Admit: 2022-12-02 | Discharge: 2022-12-02 | Disposition: A | Discharge status: Home / Self Care | Payer: MEDICARE | Attending: Emergency Medicine

## 2022-12-02 ENCOUNTER — Ambulatory Visit
Admit: 2022-12-02 | Discharge: 2022-12-03 | Payer: MEDICARE | Attending: Student in an Organized Health Care Education/Training Program | Primary: Student in an Organized Health Care Education/Training Program

## 2022-12-02 DIAGNOSIS — G43709 Chronic migraine without aura, not intractable, without status migrainosus: Principal | ICD-10-CM

## 2022-12-02 DIAGNOSIS — G44209 Tension-type headache, unspecified, not intractable: Principal | ICD-10-CM

## 2022-12-02 MED ORDER — TIZANIDINE 4 MG TABLET
ORAL_TABLET | Freq: Three times a day (TID) | ORAL | 0 refills | 10 days | Status: CP | PRN
Start: 2022-12-02 — End: 2023-01-01

## 2022-12-02 MED ORDER — DICLOFENAC 1 % TOPICAL GEL
Freq: Four times a day (QID) | TOPICAL | 0 refills | 13 days | Status: CP
Start: 2022-12-02 — End: 2023-01-01

## 2022-12-02 MED ADMIN — diphenhydrAMINE (BENADRYL) capsule/tablet 25 mg: 25 mg | ORAL | @ 06:00:00 | Stop: 2022-12-02

## 2022-12-02 MED ADMIN — prochlorperazine (COMPAZINE) tablet 10 mg: 10 mg | ORAL | @ 06:00:00 | Stop: 2022-12-02

## 2022-12-02 MED ADMIN — acetaminophen (TYLENOL) tablet 1,000 mg: 1000 mg | ORAL | @ 06:00:00 | Stop: 2022-12-02

## 2022-12-02 MED ADMIN — ketorolac (TORADOL) injection 30 mg: 30 mg | INTRAMUSCULAR | @ 06:00:00 | Stop: 2022-12-02

## 2022-12-02 NOTE — Unmapped (Signed)
Internal Medicine Clinic Visit    Reason for visit: Headaches    A/P:         1. Chronic migraine without aura without status migrainosus, not intractable    2. Tension headache      Mixed Migraine and Tension Headache   Symptoms are concerning for mixed phenotype headache, and by history he appears to have had relief of the more migrainous symptoms following migraine cocktail in the emergency department, but has residual tension type headache symptoms at this time. He does have prominent muscle pain in the upper neck which I do feel is contributing. We discussed addition of short course of muscle relaxant to see if this helps as well as trial of diclofenac gel, which she is agreeable to. Ideally he will be on preventive medications for his migraine headaches (recently prescribed propranolol following ED visit for hypertension and headache and at one point prescribed Emgality) but he is often lost to follow-up which may make this challenging.  -Discussed OTC medication use for headaches  -Start tizanidine 4 mg every 8 hours as needed  -Start diclofenac gel topically PRN  -Encouraged headache diary to better characterize symptoms  -Consider prophylactic medication and follow-up    Return in about 4 weeks (around 12/30/2022) for PCP follow up for headaches .    Staffed with Dr. Marguerita Beards, seen and discussed    __________________________________________________________    HPI:    He presents today after being evaluated in the emergency department for same complaint of headaches last night. He reports prominent headaches ocver the past few weeks. These have been different than his typical migraine headaches, though he does still get migraines in the background. His current concern is over the past 3 days he has had significant posteriorly located headaches. These have been associated with significant muscular tightness of the neck and shoulder region. He has associated mild nausea as well as photophobia. Does not note any aura. He does not note any provoking effects. These headaches have not been improving with sleeping, which typically occurs for his migraines.    In the ED he received Tylenol, Toradol, Compazine, and Benadryl and did have some relief from this. He presents for reevaluation today given his headache has not completely subsided. He notes at home he takes a variety of medications over-the-counter to help with his headaches including Tylenol, ibuprofen, BC powder, and Excedrin. This challenging to quantify how much he takes. Takes at least Tylenol and Excedrin daily over the past few days has been taking all of these medications.  __________________________________________________________    Medications:  Reviewed in EPIC  __________________________________________________________    Physical Exam:   Vital Signs:  Vitals:    12/02/22 1409   BP: 146/85   Pulse: 91   Resp: 16   Temp: 36.7 ??C (98 ??F)   TempSrc: Temporal   SpO2: 99%   Weight: 91.6 kg (202 lb)   Height: 170.2 cm (5' 7)      PTHomeBP    The patient???s Average Home Blood Pressure during the last two weeks is :   /   based on  readings    Gen: Well appearing, in NAD  HEENT: PERRL, EOM grossly intact, no conjunctival injection or scleral icterus. Tension noted in the upper trapezius and most prominent at the occipital looking insertion point.  CV: RRR, no murmurs  Pulm: CTA bilaterally, no crackles or wheezes  Abd: Soft, NTND, normal BS  Ext: No edema    PHQ-9 Score:  GAD-7 Score:       Medication adherence and barriers to the treatment plan have been addressed. Opportunities to optimize healthy behaviors have been discussed. Patient / caregiver voiced understanding.    Ocie Doyne, MD  [facility] Internal Medicine - PGY-3  Pager: [phone]

## 2022-12-02 NOTE — Unmapped (Addendum)
Thank you for visiting the clinic today! Here is what we discussed:    I'm sorry your headaches have been so bothersome. It sounds like you have mixed features of migraine and tension headaches.   You can continue taking the over-the-counter medications you have been taking. We recommend limiting your Tylenol use to less than 3000mg  per day due to risk of kidney injury. You should also limit your BC powder and ibuprofen use as able as this can cause injury to the stomach (bleeding and pain) as well as the kidneys.  I have sent a prescription for tizanidine, a muscle relaxant that should help with your neck and headache symptoms. You can also use diclofenac (Voltaren) gel on your neck muscles, heating pads, or OTC medications like icy-hot.   We want you to follow up in our clinic in 4 weeks to see how your headaches are doing and to start preventive medications.

## 2022-12-02 NOTE — Unmapped (Signed)
[facility]  Emergency Department Provider Note     ED Clinical Impression     Final diagnoses:   Nonintractable headache, unspecified chronicity pattern, unspecified headache type (Primary)      Impression, Medical Decision Making, ED Course     Impression: 42 y.o. male with PMH most significant for HTN, chronic migraines, and stage III CKD who presents with 3 days of posterior headaches as described below.    Initial vitals are generally reassuring except for some mild hypertension 151/95.upon initial evaluation, patient is well-appearing and in no acute distress.  On exam, PERRL. Cranial nerves II-XII grossly intact. Moving all four extremities equally.  No focal neurologic deficits.  Normal gait without ataxia.    Initial differential includes but is not limited to migraine headache, tension headache, amongst multiple etiologies.  Given his history of migraine headaches and reassuring neurologic exam, do not feel like patient needs a head CT at this time due to low suspicion for stroke, intracranial hemorrhage, aneurysm.  Will plan to treat patient with a migraine cocktail including Toradol, Benadryl, Compazine and Tylenol and reassess.    ED Course as of 12/02/22 1633   Fri Dec 02, 2022   0142 Reexamination, patient states that his headache has significantly proved.  Discussed with patient plan to discharge and gave appropriate return precautions.  He understands and is agreeable to plan.     ____________________________________________    The case was discussed with the attending physician, who is in agreement with the above assessment and plan.      History     Chief Complaint  Chief Complaint   Patient presents with    Headache Recurrent or Known Dx Migraines       HPI   Glenn Kennedy is a 42 y.o. male with past medical history as below who presents with 3 days of constant, occipital region headaches that are refractory to tylenol, excedrin, and ibuprofen. The patient reports associated nausea and emesis. He endorses a history of migraines. He is compliant with his antihypertensives.  Denies vision changes, cough, congestion, rhinorrhea.     Outside Historian(s): I have obtained additional history/collateral from patient's spouse.    External Records Reviewed:     Past Medical History:   Diagnosis Date    Hypertension     Migraines     Neck pain, chronic        No past surgical history on file.    No current facility-administered medications for this encounter.    Current Outpatient Medications:     amLODIPine (NORVASC) 5 MG tablet, Take 1 tablet (5 mg total) by mouth daily., Disp: 90 tablet, Rfl: 3    amlodipine (NORVASC) 5 MG tablet, Take 1 tablet (5 mg total) by mouth daily., Disp: 30 tablet, Rfl: 0    diclofenac sodium (VOLTAREN) 1 % gel, Apply 2 g topically four (4) times a day., Disp: 100 g, Rfl: 0    galcanezumab-gnlm (EMGALITY PEN) 120 mg/mL injection, Inject the contents of 1 pen (120 mg) under the skin every thirty (30) days., Disp: 1 mL, Rfl: 1    propranoloL (INDERAL) 80 MG tablet, Take 1 tablet (80 mg total) by mouth two (2) times a day., Disp: 60 tablet, Rfl: 0    tizanidine (ZANAFLEX) 4 MG tablet, Take 1 tablet (4 mg total) by mouth every eight (8) hours as needed., Disp: 30 tablet, Rfl: 0    Allergies  Patient has no known allergies.    Family History  Family History   Problem Relation Age of Onset    Stroke Mother 48        hemorrhagic    Hyperlipidemia Mother     Hypertension Mother     Migraines Mother     No Known Problems Father     No Known Problems Brother     Migraines Son     No Known Problems Maternal Grandmother     No Known Problems Maternal Grandfather        Social History  Social History     Tobacco Use    Smoking status: Every Day     Current packs/day: 0.50     Average packs/day: 0.5 packs/day for 25.5 years (12.8 ttl pk-yrs)     Types: Cigarettes     Start date: 06/01/1997    Smokeless tobacco: Never   Substance Use Topics    Alcohol use: No     Alcohol/week: 0.0 standard drinks of alcohol    Drug use: No        Physical Exam     VITAL SIGNS:      Vitals:    12/01/22 1918 12/01/22 2303   BP: (!) 151/95 129/88   Pulse: 82 68   Resp: 20 18   Temp: 36.7 ??C (98.1 ??F) 36.8 ??C (98.3 ??F)   TempSrc: Oral Oral   SpO2: 100% 100%   Weight: 90.1 kg (198 lb 9.6 oz)    Height: 170.2 cm (5' 7)      Constitutional: Alert and oriented. No acute distress.  Eyes: Conjunctivae are normal. PERRL.   HEENT: Normocephalic and atraumatic. Conjunctivae clear. No congestion. Moist mucous membranes.   Cardiovascular: Rate as above, regular rhythm. Normal and symmetric distal pulses. Brisk capillary refill. Normal skin turgor.  Respiratory: Normal respiratory effort. Breath sounds are normal. There are no wheezing or crackles heard.  Gastrointestinal: Soft, non-distended, non-tender.  Genitourinary: Deferred.  Musculoskeletal: Non-tender with normal range of motion in all extremities.  Neurologic: Normal speech and language. No gross focal neurologic deficits are appreciated. Patient is moving all extremities equally, face is symmetric at rest and with speech. Cranial nerves II-XII grossly intact. Moving all four extremities equally.  Skin: Skin is warm, dry and intact. No rash noted.  Psychiatric: Mood and affect are normal. Speech and behavior are normal.     Radiology     No orders to display       Pertinent labs & imaging results that were available during my care of the patient were independently interpreted by me and considered in my medical decision making (see chart for details).    Portions of this record have been created using Scientist, clinical (histocompatibility and immunogenetics). Dictation errors have been sought, but may not have been identified and corrected.    Documentation assistance was provided by Gus Height, Scribe on December 02, 2022 at 12:46 AM for Francis Dowse, MD.       Documentation assistance provided by the above mentioned scribe. I was present during the time the encounter was recorded. The information recorded by the scribe was done at my direction and has been reviewed and validated by me.        Francis Dowse, MD  Resident  12/02/22 [phone]

## 2022-12-02 NOTE — Unmapped (Signed)
[facility] Internal Medicine at [facility]     Reason for visit: headaches    Questions / Concerns that need to be addressed: s/p ED r/t headaches.    148/83, 95  Omron BPs (complete if screening BP has a systolic  > 129 or diastolic > 79)  BP#1 145/89   BP#2 150/83  BP#3 144/84    Average BP 146/85  (please note this as a comment in vitals)         Allergies reviewed: Yes    Medication reviewed: Yes  Pended refills? No        HCDM reviewed and updated in Epic:    We are working to make sure all of our patients??? wishes are updated in Epic and part of that is documenting a Environmental health practitioner for each patient  A Health Care Decision Maker is someone you choose who can make health care decisions for you if you are not able - who would you most want to do this for you????  was updated.        BPAs completed:  PHQ2  Influenza vaccination      COVID-19 Vaccine Summary  Which COVID-19 Vaccine was administered  Pfizer  Type:  Dates Given:                   If no: Are you interested in scheduling?     Immunization History   Administered Date(s) Administered    COVID-19 VACC,MRNA,(PFIZER)(PF) 05/08/2020    DTaP 05/06/2012    INFLUENZA TIV (TRI) PF (IM) 10/31/2007    Influenza Vaccine Quad(IM)6 MO-Adult(PF) 01/01/2016, 10/21/2016    Influenza Virus Vaccine, unspecified formulation 08/19/2017, 10/20/2018    PNEUMOCOCCAL POLYSACCHARIDE 23-VALENT 01/01/2016    TdaP 02/16/2017       __________________________________________________________________________________________    SCREENINGS COMPLETED IN FLOWSHEETS    HARK Screening       AUDIT       PHQ2       PHQ9          Link for Multi-language PHQ/GAD screeners: [email or web address]://www.phqscreeners.com/select-screener    P4 Suicidality Screener                GAD7       COPD Assessment       Falls Risk       .imcres

## 2022-12-02 NOTE — Unmapped (Signed)
Arrives from home, prior Hx migraines, pt reports headache x3d with light sensitivity and dizziness. States nothing has helped with pain

## 2022-12-03 NOTE — Unmapped (Signed)
LWBS/AMA Patient Follow-Up Call  Chart reviewed after patient LWBS after triage. Patient has since been evaluated by [facility] on another visit, per chart documentation. No further actions needed.

## 2022-12-06 NOTE — Unmapped (Signed)
I saw and evaluated the patient, participating in the key portions of the service.  I reviewed the resident’s note.  I agree with the resident’s findings and plan. Lashay Osborne R Dawnmarie Breon, MD

## 2022-12-20 ENCOUNTER — Ambulatory Visit: Admit: 2022-12-20 | Discharge: 2022-12-20 | Disposition: A | Discharge status: Home / Self Care | Payer: MEDICARE

## 2022-12-20 MED ADMIN — ketorolac (TORADOL) injection 15 mg: 15 mg | INTRAMUSCULAR | @ 06:00:00 | Stop: 2022-12-20

## 2022-12-20 MED ADMIN — prochlorperazine (COMPAZINE) injection 10 mg: 10 mg | INTRAMUSCULAR | @ 06:00:00 | Stop: 2022-12-20

## 2022-12-20 MED ADMIN — diphenhydrAMINE (BENADRYL) injection 25 mg: 25 mg | INTRAMUSCULAR | @ 06:00:00 | Stop: 2022-12-20

## 2022-12-20 NOTE — Unmapped (Addendum)
Pt reports headache starting an hour ago. Pt took Excedrin and tylenol no relief  No other symptoms  Diagnosed w migraines and HTN

## 2022-12-20 NOTE — Unmapped (Signed)
[facility]  Emergency Department Provider Note      ED Clinical Impression     Final diagnoses:   Acute nonintractable headache, unspecified headache type (Primary)       Initial Impression, ED Course, Assessment and Plan     Time seen: December 20, 2022 12:58 AM   Glenn Kennedy is a 42 y.o. male presenting with headache. There is no fever or neck stiffness to suggest meningitis. The headache was gradual onset and not maximal at the beginning so I have less suspicion for subarachnoid hemorrhage. The neurologic exam is normal at this time. They have had similar headaches in the past. There are no household members with similar symptoms to suggest carbon monoxide. The quality and location does not suggest GCA or glaucoma. This is most likely migraine or tension type headache. I will treat symptomatically and reassess. I don't feel neuroimaging or lumbar puncture is indicated at this time.     12:58 AM  Patient felt significant improvement following IM migraine cocktail.  Patient reports is back to baseline.  Discussed with patient need to consider other medications with primary care provider and he is appointment scheduled for next month.  Patient will discuss migraine management.      Medical Decision Making  Amount and/or Complexity of Data Reviewed  External Data Reviewed: notes.     Details: Care everywhere [facility] urgent care visit April 23.    Risk  OTC drugs.  Prescription drug management.  Decision regarding hospitalization.  Diagnosis or treatment significantly limited by social determinants of health.        Social Determinants of Health with Concerns     Internet Connectivity: Not on file   Food Insecurity: Not on file   Tobacco Use: High Risk (12/02/2022)    Patient History     Smoking Tobacco Use: Every Day     Smokeless Tobacco Use: Never     Passive Exposure: Not on file   Housing/Utilities: Not on file   Transportation Needs: Unmet Transportation Needs (07/20/2018)    PRAPARE - Contractor (Medical): Yes     Lack of Transportation (Non-Medical): Yes   Substance Use: Not on file   Health Literacy: Not on file   Physical Activity: Not on file   Interpersonal Safety: Not on file   Stress: Not on file   Intimate Partner Violence: Not on file   Social Connections: Not on file       ____________________________________________         History     Chief Complaint  Headache Recurrent or Known Dx Migraines      HPI   Glenn Kennedy is a 42 y.o. male who presents to the [facility] Emergency Department for headache.  Patient reports that he has frequent migraines up to 2-3 times a month.  He states that sometimes he can sleep not go away and other times they do not.  If they do not go away he comes to the emergency department for treatment.  He reports he has had to come multiple times this month.  He denies any thunderclap onset, change from previous headaches.  He reports that they are located in the back more on the left side.  Denies any blurred vision, numbness weakness, confusion, seizures or altered mental status.  Reports these are identical to numerous previous.      Past Medical History  Past Medical History:   Diagnosis Date    Hypertension  Migraines     Neck pain, chronic        Past Surgical History  No past surgical history on file.    Medications  No current facility-administered medications for this encounter.    Current Outpatient Medications:     amLODIPine (NORVASC) 5 MG tablet, Take 1 tablet (5 mg total) by mouth daily., Disp: 90 tablet, Rfl: 3    amlodipine (NORVASC) 5 MG tablet, Take 1 tablet (5 mg total) by mouth daily., Disp: 30 tablet, Rfl: 0    diclofenac sodium (VOLTAREN) 1 % gel, Apply 2 g topically four (4) times a day., Disp: 100 g, Rfl: 0    galcanezumab-gnlm (EMGALITY PEN) 120 mg/mL injection, Inject the contents of 1 pen (120 mg) under the skin every thirty (30) days., Disp: 1 mL, Rfl: 1    propranoloL (INDERAL) 80 MG tablet, Take 1 tablet (80 mg total) by mouth two (2) times a day., Disp: 60 tablet, Rfl: 0    tizanidine (ZANAFLEX) 4 MG tablet, Take 1 tablet (4 mg total) by mouth every eight (8) hours as needed., Disp: 30 tablet, Rfl: 0    Allergies  Patient has no known allergies.    Family History  Family History   Problem Relation Age of Onset    Stroke Mother 79        hemorrhagic    Hyperlipidemia Mother     Hypertension Mother     Migraines Mother     No Known Problems Father     No Known Problems Brother     Migraines Son     No Known Problems Maternal Grandmother     No Known Problems Maternal Grandfather        Social History  Social History     Tobacco Use    Smoking status: Every Day     Current packs/day: 0.50     Average packs/day: 0.5 packs/day for 25.6 years (12.8 ttl pk-yrs)     Types: Cigarettes     Start date: 06/01/1997    Smokeless tobacco: Never   Substance Use Topics    Alcohol use: No     Alcohol/week: 0.0 standard drinks of alcohol    Drug use: No       Review of Systems:   Pertinent positives and negatives are documented as per the HPI    Physical Exam     VITAL SIGNS:    ED Triage Vitals [12/19/22 2148]   Enc Vitals Group      BP 148/111      Heart Rate 85      SpO2 Pulse       Resp 16      Temp 37.1 ??C (98.7 ??F)      Temp Source Oral      SpO2 100 %      Weight 90.7 kg (200 lb)      Height 1.727 m (5' 8)      Head Circumference       Peak Flow       Pain Score       Pain Loc       Pain Edu?       Excl. in GC?        Constitutional: Alert and oriented. Well appearing and in no distress.  Eyes: Conjunctivae are normal. PERRL  ENT       Head: Normocephalic and atraumatic.       Nose: No congestion.  Mouth/Throat: Mucous membranes are moist.       Neck: No stridor.  Cardiovascular: Normal rate, regular rhythm.  Respiratory: Normal respiratory effort.  Gastrointestinal: Soft, non tender, non distend, without rebound or guarding.  Musculoskeletal: No deformities.  Neurologic: Normal speech and language. No gross focal neurologic deficits are appreciated.  Skin: Skin is warm, dry and intact. No rash noted.      Pertinent labs & imaging results that were available during my care of the patient were reviewed by me and considered in my medical decision making (see chart for details).       Jocelyn Lamer, MD  12/20/22 0100

## 2022-12-23 ENCOUNTER — Ambulatory Visit: Admit: 2022-12-23 | Discharge: 2022-12-24 | Payer: MEDICARE

## 2022-12-23 DIAGNOSIS — G43709 Chronic migraine without aura, not intractable, without status migrainosus: Principal | ICD-10-CM

## 2022-12-23 DIAGNOSIS — Z136 Encounter for screening for cardiovascular disorders: Principal | ICD-10-CM

## 2022-12-23 DIAGNOSIS — Z1159 Encounter for screening for other viral diseases: Principal | ICD-10-CM

## 2022-12-23 DIAGNOSIS — M542 Cervicalgia: Principal | ICD-10-CM

## 2022-12-23 DIAGNOSIS — Z7289 Other problems related to lifestyle: Principal | ICD-10-CM

## 2022-12-23 DIAGNOSIS — F17200 Nicotine dependence, unspecified, uncomplicated: Principal | ICD-10-CM

## 2022-12-23 DIAGNOSIS — Z23 Encounter for immunization: Principal | ICD-10-CM

## 2022-12-23 DIAGNOSIS — G8929 Other chronic pain: Principal | ICD-10-CM

## 2022-12-23 DIAGNOSIS — I10 Essential (primary) hypertension: Principal | ICD-10-CM

## 2022-12-23 LAB — LIPID PANEL
CHOLESTEROL/HDL RATIO SCREEN: 5.6 — ABNORMAL HIGH (ref 1.0–4.5)
CHOLESTEROL: 140 mg/dL (ref <=200)
HDL CHOLESTEROL: 25 mg/dL — ABNORMAL LOW (ref 40–60)
LDL CHOLESTEROL CALCULATED: 96 mg/dL (ref 40–99)
NON-HDL CHOLESTEROL: 115 mg/dL (ref 70–130)
TRIGLYCERIDES: 97 mg/dL (ref 0–150)
VLDL CHOLESTEROL CAL: 19.4 mg/dL (ref 11–50)

## 2022-12-23 MED ORDER — AMLODIPINE 5 MG-OLMESARTAN 20 MG TABLET
ORAL_TABLET | Freq: Every day | ORAL | 3 refills | 90 days | Status: CP
Start: 2022-12-23 — End: 2023-12-23

## 2022-12-23 MED ORDER — EMGALITY PEN 120 MG/ML SUBCUTANEOUS PEN INJECTOR
SUBCUTANEOUS | 3 refills | 90 days | Status: CP
Start: 2022-12-23 — End: 2023-12-18
  Filled 2023-01-27: qty 3, 90d supply, fill #0

## 2022-12-23 MED ORDER — RIZATRIPTAN 10 MG DISINTEGRATING TABLET
ORAL_TABLET | Freq: Once | ORAL | 3 refills | 45 days | Status: CP | PRN
Start: 2022-12-23 — End: 2023-12-23

## 2022-12-23 NOTE — Unmapped (Signed)
[facility] Internal Medicine Clinic - Faculty Practice  Internal Medicine Clinic Visit    Reason for visit: chronic headaches, HTN    A/P:    1. Primary hypertension    2. Chronic migraine without aura without status migrainosus, not intractable    3. Screening for ischemic heart disease    4. Neck pain, chronic    5. Need for hepatitis C screening test    6. Tobacco use disorder    7. Other problems related to lifestyle    8. Need for pneumococcal 20-valent conjugate vaccination        1. Chronic migraine without aura without status migrainosus, not intractable  Frequent ED visits for headache in setting of running out of medications.  Think cost might be an issue.  Also thinks that Botox injections worked better.  Will consult with Dr. Newt Lukes on use of Maxalt which has been helpful in the past  - galcanezumab-gnlm (EMGALITY PEN) 120 mg/mL injection; Inject the contents of 1 pen (120 mg) under the skin every thirty (30) days.  Dispense: 3 mL; Refill: 3  - rizatriptan (MAXALT-MLT) 10 MG disintegrating tablet; Dissolve 1 tablet (10 mg total) on the tongue once as needed for migraine as directed.  Dispense: 27 tablet; Refill: 3    2. Primary hypertension  Remains uncontrolled in setting of not having medications to take.  Refilled combo medicagtion  Close follow-up with [facility]   - amlodipine-olmesartan (AZOR) 5-20 mg per tablet; Take 1 tablet by mouth daily.  Dispense: 90 tablet; Refill: 3    3. Screening for ischemic heart disease  - Lipid Panel  The 10-year ASCVD risk score (Arnett DK, et al., 2019) is: 13%  Will discuss Statin theraepy at next visit    4. Neck pain, chronic  Related to his chronic headaches  - rizatriptan (MAXALT-MLT) 10 MG disintegrating tablet; Dissolve 1 tablet (10 mg total) on the tongue once as needed for migraine as directed.  Dispense: 27 tablet; Refill: 3    5. Need for hepatitis C screening test  - Hepatitis C Antibody - discussed with patient and agreeable for screening    6. Tobacco use disorder  Has cut back with encouragement of his fiance.   Declines pharm therapy.  Goal to cut use in 1/2.     7. Other problems related to lifestyle  - Hepatitis C Antibody    8. Need for pneumococcal 20-valent conjugate vaccination  - PNEUMOCOCCAL CONJUGATE VACCINE 20-VALENT      Return in about 4 weeks (around 01/20/2023) for In-person with Fredia Beets (prefer Friday); return with Dr. Rulon Eisenmenger; 3 months with Aalivia Mcgraw.        __________________________________________________________    HPI:    No acute issues.  Reports his ED visits for headache.  Reports not having any medications. Cost might be an issue. Fiance helping him to cut back on smoking and make sure that he gets back on track with healthcare.  __________________________________________________________        Medications:  Reviewed in EPIC  __________________________________________________________    Physical Exam:   Vital Signs:  Vitals:    12/23/22 1120   BP: 151/104   BP Site: L Arm   BP Position: Sitting   BP Cuff Size: Large   Pulse: 72   Temp: 36.3 ??C (97.3 ??F)   TempSrc: Temporal   SpO2: 97%   Weight: 91.9 kg (202 lb 9.6 oz)   Height: 170.2 cm (5' 7)  PTHomeBP    Gen: Well appearing, NAD  CV: RRR, no murmurs  Pulm: CTA bilaterally, no crackles or wheezes      PHQ-9 Score:     GAD-7 Score:       Medication adherence and barriers to the treatment plan have been addressed. Opportunities to optimize healthy behaviors have been discussed. Patient / caregiver voiced understanding.

## 2022-12-23 NOTE — Unmapped (Signed)
Omron BPs  BP#1 156 109 p70   BP#2 147 100 p 71  BP#3 149 103 p74    Average BP 151 104 p72  (please note this as a comment in vitals)

## 2022-12-23 NOTE — Unmapped (Addendum)
I sent the refill for Emgality shots to [facility].  There might be a prior authorization that is needed before they send to you.     When you feel like a headache is coming on, take a Maxalt with Advil/ibuprofen 600mg .     I will try to get you back in with Dr. Rulon Eisenmenger to talk about Botox injections

## 2022-12-26 LAB — HEPATITIS C ANTIBODY: HEPATITIS C ANTIBODY: NONREACTIVE

## 2022-12-27 NOTE — Unmapped (Addendum)
Prior authorization started on this patient       Key: [id number])       Denial letter is in the patient chart

## 2023-01-02 MED ORDER — AMLODIPINE 5 MG-VALSARTAN 160 MG TABLET
ORAL_TABLET | Freq: Every day | ORAL | 3 refills | 90 days | Status: CP
Start: 2023-01-02 — End: 2024-01-02

## 2023-01-02 NOTE — Unmapped (Signed)
Addended by: Talbert Forest D on: 01/02/2023 11:35 AM     Modules accepted: Orders

## 2023-01-02 NOTE — Unmapped (Signed)
Changed to amlodipine-valsartan per insurance formulary

## 2023-01-18 ENCOUNTER — Ambulatory Visit
Admit: 2023-01-18 | Discharge: 2023-01-18 | Disposition: A | Discharge status: Home / Self Care | Payer: MEDICARE | Attending: Family

## 2023-01-18 DIAGNOSIS — G43809 Other migraine, not intractable, without status migrainosus: Principal | ICD-10-CM

## 2023-01-18 MED ADMIN — ketorolac (TORADOL) injection 30 mg: 30 mg | INTRAMUSCULAR | @ 21:00:00 | Stop: 2023-01-18

## 2023-01-18 MED ADMIN — dexAMETHasone (DECADRON) tablet 10 mg: 10 mg | ORAL | @ 21:00:00 | Stop: 2023-01-18

## 2023-01-18 NOTE — Unmapped (Signed)
[facility]  Emergency Department Provider Note      ED Clinical Impression     Final diagnoses:   Other migraine without status migrainosus, not intractable (Primary)       Initial Impression, ED Course, Assessment and Plan     Patient is a 42 y.o. male with PMH of HTN, chronic migraines, and stage III CKD presenting for acute onset of posterior headache with associated photophobia last night consistent with his previous migraine headaches.     On exam, the patient is well appearing and in no acute distress. Patient's vital signs showing normal blood pressure, heart rate within normal limits, afebrile, saturating 98% on room air with no signs of any respiratory distress. Neuro exam normal.      Chart reviewed, patient with multiple ED visits with similar symptoms.  He does state today that his headache is similar to his prior headaches.  Usually resolves with a migraine cocktail.  Will give Toradol IM and Decadron p.o. to prevent rebound headache.    5:29 PM  Patient feeling better, and ready for discharge.    ____________________________________________    Time seen: January 18, 2023 2:59 PM    I have reviewed the triage vital signs and the nursing notes.    This visit was not staffed with an ED attending.    Additional Medical Decision Making     I have reviewed the vital signs and the nursing notes. Labs and radiology results that were available during my care of the patient were independently reviewed by me and considered in my medical decision making.     I reviewed the patient's prior medical records (ED note from 12/20/22 for PMH and details regarding recent visit with similar presentation).   History     Chief Complaint  Head Pain      HPI   Glenn Kennedy is a 42 y.o. male with past medical history of HTN, chronic migraines, and stage III CKD who presents to the ED for evaluation of head pain. The patient reports acute onset of posterior headache last night with associated photophobia consistent with his previous migraines. He reports he has frequent migraine headaches, typically 2-3 per month and will present to the ED for pain control when over the counter medications do not provide symptomatic relief. He took his prescribed rizatriptan last night with no symptomatic relief. He has associated photophobia but otherwise no visual changes. Denies fever, chills, chest pain, shortness of breath, dizziness or syncope.    Per chart review, he was last seen on 12/20/22 for headache consistent with his previous migraines where his symptoms improved with migraine cocktail. He followed up with his PCP following this ED visit where he was prescribed an emgality pen which he has not received yet.     Past Medical History:   Diagnosis Date    Hypertension     Migraines     Neck pain, chronic        Patient Active Problem List   Diagnosis    Chronic migraine without aura without status migrainosus, not intractable    Hypertension    Neck pain, chronic       No past surgical history on file.    No current facility-administered medications for this encounter.    Current Outpatient Medications:     amlodipine-valsartan (EXFORGE) 5-160 mg per tablet, Take 1 tablet by mouth daily., Disp: 90 tablet, Rfl: 3    galcanezumab-gnlm (EMGALITY PEN) 120 mg/mL injection, Inject the  contents of 1 pen (120 mg) under the skin every thirty (30) days., Disp: 3 mL, Rfl: 3    propranoloL (INDERAL) 80 MG tablet, Take 1 tablet (80 mg total) by mouth two (2) times a day., Disp: 60 tablet, Rfl: 0    rizatriptan (MAXALT-MLT) 10 MG disintegrating tablet, Dissolve 1 tablet (10 mg total) on the tongue once as needed for migraine as directed., Disp: 27 tablet, Rfl: 3    Allergies  Patient has no known allergies.    Family History   Problem Relation Age of Onset    Stroke Mother 66        hemorrhagic    Hyperlipidemia Mother     Hypertension Mother     Migraines Mother     No Known Problems Father     No Known Problems Brother     Migraines Son     No Known Problems Maternal Grandmother     No Known Problems Maternal Grandfather        Social History  Social History     Tobacco Use    Smoking status: Every Day     Current packs/day: 0.50     Average packs/day: 0.5 packs/day for 25.6 years (12.8 ttl pk-yrs)     Types: Cigarettes     Start date: 06/01/1997    Smokeless tobacco: Never   Substance Use Topics    Alcohol use: No     Alcohol/week: 0.0 standard drinks of alcohol    Drug use: No       Review of Systems    A complete review of systems was performed and is negative other than as addressed in the HPI.    Physical Exam     ED Triage Vitals [01/18/23 1435]   Enc Vitals Group      BP 149/105      Heart Rate 77      SpO2 Pulse       Resp 20      Temp 36.4 ??C (97.5 ??F)      Temp src       SpO2 98 %     Constitutional: Alert and oriented. Well appearing and in no distress.  Eyes: Conjunctivae are normal.  ENT       Head: Normocephalic and atraumatic.       Mouth/Throat: Mucous membranes are moist.       Neck: Supple, no meningismus.  Hematological/Lymphatic/Immunilogical: No cervical lymphadenopathy.  Cardiovascular: Normal rate, regular rhythm.   Respiratory: Normal respiratory effort. Breath sounds are normal.  Musculoskeletal: Nontender with normal range of motion in all extremities.       Right lower leg: No tenderness or edema.       Left lower leg: No tenderness or edema.  Neurologic: Normal speech and language. No gross focal neurologic deficits are appreciated.  EOMs intact, strength out of 5 in all 4 extremities, sensation intact throughout.  Negative Romberg, no pronator drift, normal finger-to-nose, normal gait.  Skin: Skin is warm, dry and intact. No rash noted.  Psychiatric: Mood and affect are normal. Speech and behavior are normal.    Pertinent labs & imaging results that were available during my care of the patient were reviewed by me and considered in my medical decision making (see chart for details).    Documentation assistance was provided by Desma Paganini, Scribe, on January 18, 2023 at 2:59 PM for Marcelle Overlie, FNP-BC.      A scribe was used when documenting this visit.  I agree with the above documentation. Signed by  Evaristo Bury, FNP on  January 18, 2023 at 5:29 PM           Evaristo Bury, FNP  01/18/23 [phone]

## 2023-01-18 NOTE — Unmapped (Signed)
Pt arrives with c/o headache onset last night.

## 2023-01-20 NOTE — Unmapped (Signed)
The PAC has received an incoming call requesting medication refill/clarification/question:    Caller: Glenn Kennedy    Best callback number: [phone]    Type of request (refill, clarification, question): Refill    Name of medication: Headache medication , for blood pressure , ointment used on neck , patient states. Does not recall the name of the medications.    Is there a preferred amount requested (e.g. 30 pills, 3 months worth - if no leave blank):     Desired pharmacy:      [facility]   [address] [place], [place] [address]   [phone]    Does caller request a callback from a nurse to discuss?:     Glenn Kennedy   01/20/2023

## 2023-01-23 MED ORDER — DICLOFENAC 1 % TOPICAL GEL
Freq: Four times a day (QID) | TOPICAL | 0 refills | 13 days
Start: 2023-01-23 — End: 2023-02-22

## 2023-01-23 NOTE — Unmapped (Signed)
Called and spoke to pt. Informed him there are refills available at [facility] for his amlodipine - valsartan and rizatriptan. There are also refills available for [facility] at [facility], [phone]. Pt advised to contact pharmacies for refills. He verbalizes understanding. Pt is also requesting refill of Voltaren gel.

## 2023-01-25 MED ORDER — DICLOFENAC 1 % TOPICAL GEL
Freq: Four times a day (QID) | TOPICAL | 0 refills | 13 days | Status: CP
Start: 2023-01-25 — End: 2023-02-24

## 2023-01-27 ENCOUNTER — Ambulatory Visit: Admit: 2023-01-27 | Discharge: 2023-01-28 | Payer: MEDICARE

## 2023-01-27 DIAGNOSIS — I10 Essential (primary) hypertension: Principal | ICD-10-CM

## 2023-01-27 MED ORDER — AMLODIPINE 5 MG-VALSARTAN 160 MG TABLET
ORAL_TABLET | Freq: Every day | ORAL | 3 refills | 90 days | Status: CP
Start: 2023-01-27 — End: 2024-01-27
  Filled 2023-01-31: qty 90, 90d supply, fill #0

## 2023-01-27 NOTE — Unmapped (Signed)
DASH Diet: After Your Visit  Your Care Instructions  The DASH diet is an eating plan that can help lower your blood pressure. DASH stands for Dietary Approaches to Stop Hypertension. Hypertension is high blood pressure.  The DASH diet focuses on eating foods that are high in calcium, potassium, and magnesium. These nutrients can lower blood pressure. The foods that are highest in these nutrients are fruits, vegetables, low-fat dairy products, nuts, seeds, and legumes. But taking calcium, potassium, and magnesium supplements instead of eating foods that are high in those nutrients does not have the same effect. The DASH diet also includes whole grains, fish, and poultry.  The DASH diet is one of several lifestyle changes your doctor may recommend to lower your high blood pressure. Your doctor may also want you to decrease the amount of sodium in your diet. Lowering sodium while following the DASH diet can lower blood pressure even further than just the DASH diet alone.  Follow-up care is a key part of your treatment and safety. Be sure to make and go to all appointments, and call your doctor if you are having problems. It's also a good idea to know your test results and keep a list of the medicines you take.  How can you care for yourself at home?  Following the DASH diet  Eat 4 to 5 servings of fruit each day. A serving is 1 medium-sized piece of fruit, ?? cup chopped or canned fruit, 1/4 cup dried fruit, or 4 ounces (?? cup) of fruit juice. Choose fruit more often than fruit juice.  Eat 4 to 5 servings of vegetables each day. A serving is 1 cup of lettuce or raw leafy vegetables, ?? cup of chopped or cooked vegetables, or 4 ounces (?? cup) of vegetable juice. Choose vegetables more often than vegetable juice.  Get 2 to 3 servings of low-fat and fat-free dairy each day. A serving is 8 ounces of milk, 1 cup of yogurt, or 1 ?? ounces of cheese.  Eat 6 to 8 servings of grains each day. A serving is 1 slice of bread, 1 ounce of dry cereal, or ?? cup of cooked rice, pasta, or cooked cereal. Try to choose whole-grain products as much as possible.  Limit lean meat, poultry, and fish to 2 servings each day. A serving is 3 ounces, about the size of a deck of cards.  Eat 4 to 5 servings of nuts, seeds, and legumes (cooked dried beans, lentils, and split peas) each week. A serving is 1/3 cup of nuts, 2 tablespoons of seeds, or ?? cup cooked dried beans or peas.  Limit fats and oils to 2 to 3 servings each day. A serving is 1 teaspoon of vegetable oil or 2 tablespoons of salad dressing.  Limit sweets and added sugars to 5 servings or less a week. A serving is 1 tablespoon jelly or jam, ?? cup sorbet, or 1 cup of lemonade.  Eat less than 2,300 milligrams (mg) of sodium a day. If you have high blood pressure, diabetes, or chronic kidney disease, if you are African-American, or if you are older than age 4, try to limit the amount of sodium you eat to less than 1,500 mg a day.  Tips for success  Start small. Do not try to make dramatic changes to your diet all at once. You might feel that you are missing out on your favorite foods and then be more likely to not follow the plan. Make small changes, and stick  with them. Once those changes become habit, add a few more changes.  Try some of the following:  Make it a goal to eat a fruit or vegetable at every meal and at snacks. This will make it easy to get the recommended amount of fruits and vegetables each day.  Try yogurt topped with fruit and nuts for a snack or healthy dessert.  Add lettuce, tomato, cucumber, and onion to sandwiches.  Combine a ready-made pizza crust with low-fat mozzarella cheese and lots of vegetable toppings. Try using tomatoes, squash, spinach, broccoli, carrots, cauliflower, and onions.  Have a variety of cut-up vegetables with a low-fat dip as an appetizer instead of chips and dip.  Sprinkle sunflower seeds or chopped almonds over salads. Or try adding chopped walnuts or almonds to cooked vegetables.  Try some vegetarian meals using beans and peas. Add garbanzo or kidney beans to salads. Make burritos and tacos with mashed pinto beans or black beans.   Where can you learn more?   Go to http://[facility]  Enter [phone] in the search box to learn more about DASH Diet: After Your Visit.   ?? 2006-2014 [employer]. Care instructions adapted under license by [facility]. This care instruction is for use with your licensed healthcare professional. If you have questions about a medical condition or this instruction, always ask your healthcare professional. [employer] Inc[employer] disclaims any warranty or liability for your use of this information.  Content Version: 10.0.270728; Last Revised: Mar 28, 2012

## 2023-01-27 NOTE — Unmapped (Signed)
Assessment and Plan:    1. Primary hypertension      Uncontrolled  Not taking antihypertensive prescribed  Unclear why perhaps he thought being mailed to his home?  Sent to [facility] today to pick up 90 day supply   Consider using [facility] pharmacy and having meds picked up at visits    We discussed in general smoking cessation and he is pre contemplative    Discussed diet/exercise and lifestyle as well.  DASH diet recommend and info given    Keep pcp fu upcoming.    I spent a total of 15 min total including pre intra and post visit activities    ______________________________________________________________________    CC:  Follow-up hypertension    Patient Active Problem List   Diagnosis    Chronic migraine without aura without status migrainosus, not intractable    Hypertension    Neck pain, chronic       HPI:  Glenn Kennedy is a 42 yo omale with migrainres and HTN here for HTN follow up.    Has not been taking amlodipine-valsartan.  Says he never got it    Waiting on emgality to be mailed to him, to be at his home soon he was told    Has taken bp medication before and tolerated this one well no side effects    He smokes 1 ppd and tries not to eat out much    No alcohol    No chest pain, sob.  Has a posterior headache today that is mild, simliar to chronic migarines.  Is overdue for emgality shot    ROS: ROS    Exam:  BP 154/110  - Pulse 78  - Temp 37.1 ??C (98.7 ??F) (Temporal)  - Ht 175.3 cm (5' 9)  - Wt 94.8 kg (209 lb)  - SpO2 98%  - BMI 30.86 kg/m??   Gen:  Pleasant NAD  CV:  RRR no mrg  Lungs:  CTA bl no wheeze, crackles  Ext:  No LE edema  Psych:  A&O x 3, appropriate affect, engaged    Medication adherence and barriers to the treatment plan have been addressed. Opportunities to optimize healthy behaviors have been discussed. Patient / caregiver voiced understanding.

## 2023-01-27 NOTE — Unmapped (Signed)
 Internal Medicine at [facility]     Type of visit: face to face    Are you located in [place]? (for virtual visits only) N/A    Reason for visit: Follow up    Omron BPs (complete if screening BP has a systolic  > 130 or diastolic > 80)  BP#1 155/112 75   BP#2 153/109 76  BP#3 155/110 79    Average BP 154/110 77  (please note this as a comment in vitals)           HCDM reviewed and updated in Epic:    We are working to make sure all of our patients??? wishes are updated in Epic and part of that is documenting a Environmental health practitioner for each patient  A Health Care Decision Maker is someone you choose who can make health care decisions for you if you are not able - who would you most want to do this for you????  is already up to date.      COVID-19 Vaccine Summary  Which COVID-19 Vaccine was administered  Pfizer  Type:  Dates Given:          Immunization History   Administered Date(s) Administered    COVID-19 VACC,MRNA,(PFIZER)(PF) 05/08/2020    DTaP 05/06/2012    Hepatitis B Vaccine, Unspecified Formulation 10/04/1994, 12/15/1994, 04/19/1995    INFLUENZA TIV (TRI) PF (IM) 10/31/2007    Influenza Vaccine Quad(IM)6 MO-Adult(PF) 01/01/2016, 10/21/2016, 12/02/2022    Influenza Virus Vaccine, unspecified formulation 08/19/2017, 10/20/2018    MMR 12/15/1994    PNEUMOCOCCAL POLYSACCHARIDE 23-VALENT 01/01/2016    Pneumococcal Conjugate 20-valent 12/23/2022    TD(TDVAX),ADSORBED,2LF(IM)(PF) 04/19/1995, 02/19/2002    TdaP 02/16/2017       __________________________________________________________________________________________

## 2023-03-18 ENCOUNTER — Ambulatory Visit
Admit: 2023-03-18 | Discharge: 2023-03-18 | Disposition: A | Discharge status: Home / Self Care | Payer: MEDICARE | Attending: Student in an Organized Health Care Education/Training Program

## 2023-03-18 DIAGNOSIS — G44209 Tension-type headache, unspecified, not intractable: Principal | ICD-10-CM

## 2023-03-18 MED ADMIN — ketorolac (TORADOL) injection 30 mg: 30 mg | INTRAVENOUS | @ 21:00:00 | Stop: 2023-03-18

## 2023-03-18 MED ADMIN — dexAMETHasone oral use (DECADRON) 8 mg: 8 mg | ORAL | @ 21:00:00 | Stop: 2023-03-18

## 2023-03-18 MED ADMIN — metoclopramide (REGLAN) injection 10 mg: 10 mg | INTRAVENOUS | @ 21:00:00 | Stop: 2023-03-18

## 2023-03-18 NOTE — Unmapped (Signed)
Patient reports pain to back of head for a week. Denies any n/v or photosensitivity. No neuro deficits in triage. H/o migraine headache

## 2023-03-18 NOTE — Unmapped (Signed)
[facility]  Emergency Department Provider Note      ED Clinical Impression      Final diagnoses:   Acute non intractable tension-type headache (Primary)            Impression, Medical Decision Making, Progress Notes and Critical Care      Impression, Differential Diagnosis and Plan of Care    Versus tension headache less likely viral syndrome    Likely suffering from typical migraine headache however there is posterior bilateral skull base pain with tenderness on palpation raising concern for tension headache.  Patient denies infective symptoms low concern for viral syndrome.  There are no new characteristics to his typical head pain lowering concern for space-occupying lesion.  He denies balance issues also along concern for space-occupying lesion.    Independent Interpretation of Studies    I have independently interpreted the following studies:  None    Discussion of Management with other Providers or Support Staff    I discussed the management of this patient with the:  None    Considerations Regarding Disposition/Escalation of Care and Critical Care    Indications for observation/admission (or consideration of observation/admission) and/or appropriateness for outpatient management: None  Patient/Family/Caregiver Discussions: Expectant management of symptoms ED return precautions  Diagnostic Tests Considered But Not Done: CT  Prescription Drugs Provided or Considered But Not Given: Steroid, anti-inflammatory analgesia  Social Determinants of Health which significantly affected care: None      Additional Progress Notes    ED Course as of 03/18/23 1729   Sat Mar 18, 2023   1729 Patient reports complete resolution of symptoms and request for discharge         Portions of this record have been created using Dragon dictation software. Dictation errors have been sought, but may not have been identified and corrected.    See chart and nursing documentation for additional details.    ____________________________________________         History        Reason for Visit  Headache Recurrent or Known Dx Migraines      HPI   Glenn Kennedy is a 42 y.o. male PMH of migraine, HTN who presents with head pain.  Patient reports insidious onset of moderate to severe posterior head pain at the bilateral skull bases.  Reports onset of photophobia while in the emergency department.  He has utilized Tylenol and Excedrin without significant improvement of head pain.  He denies trauma, no characteristics of head pain, balance issues, fevers, chills, chest pain, shortness of breath, nausea/emesis, abdominal pain, changes in urine or stool characteristics or new back pain.    Outside Historian(s)  (EMS, Significant Other, Family, Parent, Caregiver, Friend, Patent examiner, etc.)    None    External Records Reviewed  (Inpatient/Outpatient notes, Prior labs/imaging studies, Care Everywhere, PDMP, External ED notes, etc)    Prior ED notes    Past Medical History:   Diagnosis Date    Hypertension     Migraines     Neck pain, chronic        Patient Active Problem List   Diagnosis    Chronic migraine without aura without status migrainosus, not intractable    Hypertension    Neck pain, chronic       No past surgical history on file.    No current facility-administered medications for this encounter.    Current Outpatient Medications:     amlodipine-valsartan (EXFORGE) 5-160 mg per tablet, Take  1 tablet by mouth daily., Disp: 90 tablet, Rfl: 3    galcanezumab-gnlm (EMGALITY PEN) 120 mg/mL injection, Inject the contents of 1 pen (120 mg) under the skin every thirty (30) days., Disp: 3 mL, Rfl: 3    propranoloL (INDERAL) 80 MG tablet, Take 1 tablet (80 mg total) by mouth two (2) times a day., Disp: 60 tablet, Rfl: 0    rizatriptan (MAXALT-MLT) 10 MG disintegrating tablet, Dissolve 1 tablet (10 mg total) on the tongue once as needed for migraine as directed., Disp: 27 tablet, Rfl: 3    Allergies  Patient has no known allergies.    Family History   Problem Relation Age of Onset    Stroke Mother 25        hemorrhagic    Hyperlipidemia Mother     Hypertension Mother     Migraines Mother     No Known Problems Father     No Known Problems Brother     Migraines Son     No Known Problems Maternal Grandmother     No Known Problems Maternal Grandfather        Social History  Social History     Tobacco Use    Smoking status: Every Day     Current packs/day: 0.50     Average packs/day: 0.5 packs/day for 25.8 years (12.9 ttl pk-yrs)     Types: Cigarettes     Start date: 06/01/1997    Smokeless tobacco: Never   Substance Use Topics    Alcohol use: No     Alcohol/week: 0.0 standard drinks of alcohol    Drug use: No          Physical Exam     This provider entered the patient's room: Yes:    If this provider did enter the patient room, the following was PPE worn: Surgical mask, eye protection and gloves     ED Triage Vitals [03/18/23 1557]   Enc Vitals Group      BP 151/105      Heart Rate 90      SpO2 Pulse       Resp 16      Temp 37 ??C (98.6 ??F)      Temp src       SpO2 99 %      Weight       Height       Head Circumference       Peak Flow       Pain Score       Pain Loc       Pain Edu?       Excl. in GC?        Constitutional: Alert and oriented. Well appearing and in no distress.  Eyes: Conjunctivae are normal.  ENT       Head: Normocephalic and atraumatic.       Nose: No congestion.       Mouth/Throat: Mucous membranes are moist.       Neck: No stridor.  Hematological/Lymphatic/Immunilogical: No cervical lymphadenopathy.  Cardiovascular: Normal rate, regular rhythm. Normal and symmetric distal pulses are present in all extremities.  Respiratory: Normal respiratory effort. Breath sounds are normal.  Gastrointestinal: Soft and nontender. There is no CVA tenderness.  Genitourinary: Deferred  Musculoskeletal: Normal range of motion in all extremities.       Right lower leg: No tenderness or edema.       Left lower leg: No tenderness or edema.  Neurologic: Normal  speech and language. No gross focal neurologic deficits are appreciated.  Skin: Skin is warm, dry and intact. No rash noted.  Psychiatric: Mood and affect are normal. Speech and behavior are normal.       Radiology     No orders to display         Procedures     None               Ann Lions, PA  03/18/23 1729

## 2023-03-31 ENCOUNTER — Ambulatory Visit: Admit: 2023-03-31 | Discharge: 2023-04-01 | Payer: MEDICARE

## 2023-03-31 DIAGNOSIS — G8929 Other chronic pain: Principal | ICD-10-CM

## 2023-03-31 DIAGNOSIS — M25511 Pain in right shoulder: Principal | ICD-10-CM

## 2023-03-31 DIAGNOSIS — I10 Essential (primary) hypertension: Principal | ICD-10-CM

## 2023-03-31 DIAGNOSIS — E785 Hyperlipidemia, unspecified: Principal | ICD-10-CM

## 2023-03-31 DIAGNOSIS — G43709 Chronic migraine without aura, not intractable, without status migrainosus: Principal | ICD-10-CM

## 2023-03-31 DIAGNOSIS — M542 Cervicalgia: Principal | ICD-10-CM

## 2023-03-31 DIAGNOSIS — M25512 Pain in left shoulder: Principal | ICD-10-CM

## 2023-03-31 MED ORDER — ROSUVASTATIN 5 MG TABLET
ORAL_TABLET | Freq: Every evening | ORAL | 3 refills | 90 days | Status: CP
Start: 2023-03-31 — End: 2024-03-30

## 2023-03-31 MED ORDER — AMLODIPINE 10 MG-VALSARTAN 160 MG TABLET
ORAL_TABLET | Freq: Every day | ORAL | 3 refills | 90 days | Status: CP
Start: 2023-03-31 — End: ?

## 2023-03-31 MED ORDER — AMLODIPINE 5 MG-VALSARTAN 160 MG TABLET
ORAL_TABLET | Freq: Every day | ORAL | 3 refills | 90 days | Status: CN
Start: 2023-03-31 — End: 2024-03-30

## 2023-03-31 MED ORDER — PROPRANOLOL 80 MG TABLET
ORAL_TABLET | Freq: Two times a day (BID) | ORAL | 3 refills | 45 days | Status: CP
Start: 2023-03-31 — End: ?

## 2023-03-31 NOTE — Unmapped (Signed)
[facility]  Internal Medicine Clinic Visit    Reason for visit: chronic headaches, HTN    A/P:     Diagnosis ICD-10-CM Associated Orders   1. Chronic migraine without aura without status migrainosus, not intractable  G43.709 propranolol (INDERAL) 80 MG tablet      2. Neck pain, chronic  M54.2 propranolol (INDERAL) 80 MG tablet    G89.29 Ambulatory referral to Physical Therapy      3. Primary hypertension  I10 amlodipine-valsartan (EXFORGE) 10-160 mg per tablet      4. Hyperlipidemia, unspecified hyperlipidemia type  E78.5 rosuvastatin (CRESTOR) 5 MG tablet      5. Chronic pain of both shoulders  M25.511     G89.29     M25.512            1. Chronic migraine without aura without status migrainosus, not intractable  Continues to use Maxalt as needed for migraines as well as Emgality once a month.  He is also prescribed propranolol 80 mg twice a day however he has been out for the last 3-4 months due to the prescription being sent to a different pharmacy.  Overall, his frequency of migraines has decreased dramatically over the last few months however.  He is now only having migraines approximately 1 time per week compared to 4-5 times per week previously.  We will provide a refill for his propranolol and otherwise continue his Maxalt as needed for migraines.  [facility]  He is aware of how to call to schedule his delivery moving forward.  - Continue propranolol preventative therapy  - Continue Maxalt as needed for migraines  - Continue Emgality  - Follow-up with Dr. Rulon Eisenmenger in 1 month    2. Primary hypertension  Blood pressure remains slightly elevated in clinic today, average 138/93.  Repeat BP shows elevated diastolic reading mid 90s.  Today we will increase to 10-160 mg amlodipine-valsartan.  Will plan for follow-up in 4-6 weeks for blood pressure follow-up.  - Increase amlodipine-valsartan to 10-160 mg  - Follow-up in 4-6 weeks    3. Screening for ischemic heart disease  The 10-year ASCVD risk score (Arnett DK, et al., 2019) is: 11.1%    Values used to calculate the score:      Age: 42 years      Sex: Male      Is Non-Hispanic African American: Yes      Diabetic: No      Tobacco smoker: Yes      Systolic Blood Pressure: 138 mmHg      Is BP treated: Yes      HDL Cholesterol: 25 mg/dL      Total Cholesterol: 140 mg/dL    Note: For patients with SBP <90 or >200, Total Cholesterol <130 or >320, HDL <20 or >100 which are outside of the allowable range, the calculator will use these upper or lower values to calculate the patient???s risk score.     Today we discussed his ASCVD risk due to his hypertension and smoking history.  He is in the contemplative phase and is not interested in stopping tobacco use today.  Will continue to discuss at further visits.  Planning to start rosuvastatin 5 mg daily today.  - Start rosuvastatin 5 mg daily    4.  Bilateral shoulder pain  Associated with his migraines.  Discussed physical therapy at home and elected to refer to physical therapy in person for his  history of bilateral shoulder arthritis.  Discussed symptomatic treatment in the meantime with Voltaren gel and lidocaine patches.  - Physical therapy referral  - Lidocaine patches, Voltaren gel as needed    6. Tobacco use disorder  As discussed above, in contemplative phase and not yet ready to stop using tobacco.  He does believe he can stop using tobacco eventually however and would like to at some point.  Will plan to address at next visit.  We discussed resources available if he were interested in stopping.  - Further discuss at next visit      Return in about 4 weeks (around 04/28/2023), or for blood pressure check,.        __________________________________________________________    HPI:    Today presents to the clinic for an in person visit for follow-up.  Today he denies any recent systemic symptoms including fevers, chills or myalgias.  No recent hospitalization or illnesses.  Overall, he has been doing well although he has been out of propranolol for the last 3-4 months.  Despite this he has had reducing frequency of his migraines now once weekly compared to it previously being 4-5 times weekly.  He has not had any recent ED visits and today he is feeling well.  Denies any lightheadedness, dizziness, chest pain, dyspnea or palpitations.  Denies any new lower extremity swelling.  He does continue to have bilateral shoulder pain which is chronic and that he feels associated with his migraines however.  The pain starts in his shoulders and over a few hours it then progresses to the back of his scalp and his occipital region.  This seems to provoke his migraines and he has worked to massage out the pain and do exercises to help with the arthritis pain.  He is interested in physical therapy referral.  Otherwise he is doing well today and has no acute complaints.    __________________________________________________________        Medications:  Reviewed in EPIC  __________________________________________________________    Physical Exam:   Vital Signs:  Vitals:    03/31/23 1036   BP: 138/93   BP Site: L Arm   BP Position: Sitting   BP Cuff Size: Large   Pulse: 84   Resp: 18   Temp: 36.9 ??C (98.5 ??F)   TempSrc: Temporal   SpO2: 97%   Weight: 96.6 kg (213 lb)   Height: 175.3 cm (5' 9)            PTHomeBP    Gen: Well appearing, NAD  CV: RRR, no murmurs  Respiratory: Normal work of breathing, clear to auscultation bilaterally without wheezes or crackles.  Abdomen: Soft, nontender  Extremities: No significant edema, normal distal pulses    Medication adherence and barriers to the treatment plan have been addressed. Opportunities to optimize healthy behaviors have been discussed. Patient / caregiver voiced understanding.      Portions of this record have been created using Scientist, clinical (histocompatibility and immunogenetics). Dictation errors have been sought, but may not have been identified and corrected.

## 2023-03-31 NOTE — Unmapped (Addendum)
1.  Today we are increasing your blood pressure medicine.  We will send in a new prescription to [facility] in [place].  2.  We are sending in a referral for physical therapy for your shoulders.  They will call you to make this appointment.  3.  We are sending in a new medication for your cholesterol.  The medication is called rosuvastatin (Crestor), we will also send this into [facility].  4.  We will plan to see you back in about 6 weeks for follow-up on your blood pressure

## 2023-03-31 NOTE — Unmapped (Signed)
Omron BPs  BP#1 140/96   BP#2 143/94  BP#3 131/90    Average BP 138/93  (please note this as a comment in vitals)

## 2023-04-19 ENCOUNTER — Institutional Professional Consult (permissible substitution): Admit: 2023-04-19 | Discharge: 2023-04-20 | Payer: MEDICARE | Attending: Neurology | Primary: Neurology

## 2023-04-19 MED ORDER — EMGALITY PEN 120 MG/ML SUBCUTANEOUS PEN INJECTOR
SUBCUTANEOUS | 11 refills | 30 days | Status: CP
Start: 2023-04-19 — End: 2024-04-13
  Filled 2023-04-24: qty 1, 30d supply, fill #0

## 2023-04-19 NOTE — Unmapped (Signed)
TELENEUROLOGY REPORT    [address]  [facility] INTERNAL MEDICINE [place]  [address]  [place] [address]  [phone]    Prior visit with this Neurologist: 11/25/2019    Assessment/Plan:     Hobbes Nault is a 42 y.o. male  who presents for evaluation and management with medication management of Chronic migraine without aura without status migrainosus.  He has previously done really well with prophylaxis and last had an injection of galcanezumab a month ago.  His headache frequency has reduced since resuming Emgality.  In the past 3 months he has had numerous ED visits for headache.  He reminds me that his mother does not think it is working as well as Botox.  Limiting his care is access such that today's visit was initially canceled because he did not have a ride.  I offered a virtual visit and we were able to connect via the telephone but he did not have access to video today.  For this reason, Botox is not a good plan.      His headaches have not changed in character and he has had multiple evaluations in recent months in the emergency department.  I have no concern for any red flags.  Will monitor efficacy of Emgality for the next 2 months.    Should he continue to find that this is not very effective, we can switch him to one of the other injectables.  He [facility] the contact information [facility] and knows to reach them for mailing the medication to him directly. Reviewed abortive Rx and provided refills- Rizatriptan plus NSAID. Not too much nausea, so no need for antiemetic at this time.  I encouraged him to keep track of all of his headaches.    HEALTH EDUCATION/HEALTH LITERACY: PATIENT EDUCATION was provided. Reviewed the plan of care in detail. The benefits and risks of each of the above procedures, benefits and alternative options were reviewed with the patient.     Medication Management: There is a high risk for medication side effects, which will require ongoing monitoring and dose adjustment. Medication side effect profile was reviewed in detail with patient. Medication safety and drug interactions reviewed on ERx.     Patient Counselling: All the patient's questions and concerns were addressed to their stated satisfaction .      Barriers to Care: Access to care: Health literacy.      Return in about 2 months (around 06/19/2023) for Return for re-evaluation of current plan of care., Video OK.     Subjective     SOURCE: The history is obtained from the patient who appears to be a reliable historian. Additional history is obtained from review of available medical records. Open-ended and close-ended questions, as well as review of EPIC chart, including CareEverywhere if available.    History of Present Illness:  Mr. Red Holiday is a 42 y.o.  male seen in followup for ongoing evaluation and management of chronic migraine and cervicalgia. Sometimes last a few days. None as severe in the last 2 months.  He is back on Emgality as of last month.  No side effects.  His mother does not feel that it works as well as Botox to it.  Multiple ED visits for headache.     Medication History:   Also on Topiramate 100 MG BID- no sure of adherence.    Botox.  Emgality: April 24, 2019  resumed Emgality, since he had done really  well on Emgality, with almost no headaches, until he moved to [place] and did not receive it for 7-8 months while there. EMGALITY approved 01/18/2019 through 04/23/2019.    BC  Excedrin  Rizatriptan helps some.     Objective     GENERAL PHYSICAL EXAM:    Vital Signs: Not available for review due to Video Telehealth visit.   Ht 175.3 cm (5' 9)  - Wt 96.6 kg (213 lb)  - BMI 31.45 kg/m??   Estimated body mass index is 31.45 kg/m?? as calculated from the following:    Height as of this encounter: 175.3 cm (5' 9).    Weight as of this encounter: 96.6 kg (213 lb).  Facility age limit for growth %iles is 20 years.    Wt Readings from Last 8 Encounters:   04/19/23 96.6 kg (213 lb)   03/31/23 96.6 kg (213 lb)   01/27/23 94.8 kg (209 lb)   12/23/22 91.9 kg (202 lb 9.6 oz)   12/19/22 90.7 kg (200 lb)   12/02/22 91.6 kg (202 lb)   12/01/22 90.1 kg (198 lb 9.6 oz)   11/30/22 90.7 kg (200 lb)     General Comments: The patient participated fully in the visit.     NEUROLOGICAL EXAMINATION   Mental Status  Speech and language were normal at the bedside.    Diagnostic Studies and Review of Records:   I have independently reviewed the studies as noted. Medical Records: reviewed including multiple ED visits.  Brain Imaging: CT in 2015. Report reviewed.    IMPRESSION: No evidence of acute intracranial pathology.    Continuity of care: I am planning to provided needed longitudinal care that are part of the ongoing, longitudinal care relationship related to a patient???s single, serious condition or complex condition (S) (    ICD-10-CM   1. Chronic migraine without aura without status migrainosus, not intractable  G43.709   .   The patient's care will require a physician-patient relationship that long-term helps the patient with improved care, reduced burden of symptoms, medication management and overall improvement in QOL.       The patient reports they are physically located in [place] and is currently: not at home. I conducted a phone visit.  I spent 8 minutes on the phone call with the patient on the date of service .  The patient was unable to connect on video.    Dr. Hollie Beach  CC: Gladman, Wilhemena Durie, MD  Dictated using voice-activated software. Despite editing, typographical errors may exist.

## 2023-04-19 NOTE — Unmapped (Signed)
[facility] Internal Medicine at [facility]     Reason for visit: Follow up    Questions / Concerns that need to be addressed: Emgality refill        Allergies reviewed: Yes    Medication reviewed: Yes  Pended refills? Yes        HCDM reviewed and updated in Epic:    We are working to make sure all of our patients??? wishes are updated in Epic and part of that is documenting a Environmental health practitioner for each patient  A Health Care Decision Maker is someone you choose who can make health care decisions for you if you are not able - who would you most want to do this for you????  is already up to date.        Immunization History   Administered Date(s) Administered    COVID-19 VACC,MRNA,(PFIZER)(PF) 05/08/2020    DTaP 05/06/2012    Hepatitis B Vaccine, Unspecified Formulation 10/04/1994, 12/15/1994, 04/19/1995    INFLUENZA TIV (TRI) PF (IM) 10/31/2007    Influenza Vaccine Quad(IM)6 MO-Adult(PF) 01/01/2016, 10/21/2016, 12/02/2022    Influenza Virus Vaccine, unspecified formulation 08/19/2017, 10/20/2018    MMR 12/15/1994    PNEUMOCOCCAL POLYSACCHARIDE 23-VALENT 01/01/2016    Pneumococcal Conjugate 20-valent 12/23/2022    TD(TDVAX),ADSORBED,2LF(IM)(PF) 04/19/1995, 02/19/2002    TdaP 02/16/2017       __________________________________________________________________________________________    SCREENINGS COMPLETED IN FLOWSHEETS    HARK Screening       AUDIT       PHQ2       PHQ9          Link for Multi-language PHQ/GAD screeners: [email or web address]/[email or web address]    P4 Suicidality Screener                GAD7       COPD Assessment       Falls Risk       .imcres

## 2023-04-19 NOTE — Unmapped (Signed)
It has been my pleasure to participate in your care. Here is a brief summary of our discussion today:        Please note: All results will be released immediately in MyChart. Please allow 2 days for me to review them and send an interpretation.  Do not use MyChart for urgent issues.       Warmest Regards,    Dr. Adama Ferber C G Fama Muenchow  Board Certified: Adult Neurology & Vascular Neurology  [facility] Department of Internal Medicine  [facility] of Neurology  [place], [place]      Frequently Requested Information:    Internal Medicine Clinic:    [phone]   Fax: [phone]  Geriatric Specialty Clinic:  [phone]   Fax: [phone]   After Hours: [phone].      Need help paying for medications, the cost of care or a procedure estimate?   [facility] Financial Assistance team toll free at [phone]- option 3 for Estimates  Estimates Help - [facility] ([email or web address])     Scheduling Tests?   Here are some common contact numbers - It is unlikely you will need all. Please choose ONLY those appropriate for you.     MRI scan, CT scan, DAT/PET Scan:  [phone]  EMG/EEG/Evoked Potentials/Autonomic Testing: [phone]  (DONE AT [facility] ONLY)  Memory/Cognitive testing: [phone]   Myelogram CT: There are 2 steps for this test that must be scheduled in this sequence: FIRST: Myelogram: [phone], THEN schedule CT (ideally same day) [phone] option 2    Medication Assistance:  [facility] ships directly to your home [phone].   [phone] for refills- Option 1 if the Prescription Number begins with &- use Automated Touchtone Prescription Refill System.   Option 2 to speak with a Representative (M-F 8-4:30 PM)  Other ways to lower your cost:   GoodRx.com has coupon codes for medications  Costplusdrugs.com  Pharmacy.amazon.com  MedicationName on Search Engine and Cost Savings- you may be eligible for a Co-Pay card which may lower your out-of-pocket cost.     LEARN MORE ABOUT YOUR BRAIN and NEUROLOGICAL HEALTH:   www.brainandlife.org  Stroke Recovery and Support (https://www.stroke.org/)  Learn more about memory disorders.   Coronavirus & Vaccination/Booster updates    RESEARCH OPPORTUNITIES: research.[employer].edu      Thank you for choosing [facility]: Changing Lives for the Better.

## 2023-05-19 ENCOUNTER — Ambulatory Visit: Admit: 2023-05-19 | Payer: MEDICARE

## 2023-07-05 ENCOUNTER — Telehealth: Admit: 2023-07-05 | Discharge: 2023-07-06 | Payer: MEDICARE | Attending: Neurology | Primary: Neurology

## 2023-07-05 DIAGNOSIS — G43709 Chronic migraine without aura, not intractable, without status migrainosus: Principal | ICD-10-CM

## 2023-07-05 MED ORDER — EMGALITY PEN 120 MG/ML SUBCUTANEOUS PEN INJECTOR
SUBCUTANEOUS | 11 refills | 30.00000 days | Status: CP
Start: 2023-07-05 — End: 2024-06-29

## 2023-07-05 NOTE — Unmapped (Unsigned)
TELENEUROLOGY REPORT    [address]  [facility] INTERNAL MEDICINE [place]  [address]  [place] [address]  [phone]    Prior visit with this Neurologist: 04/19/2023    Assessment/Plan:     Glenn Kennedy is a 42 y.o. male  who presents for evaluation and management with medication management of Chronic migraine without aura without status migrainosus.  He has previously done really well with prophylaxis on galcanezumab.  His headache frequency has reduced since resuming Emgality.      However,  continues to report neck pain- previously resolved with occipital nerve block which he requests again today.     PLAN:    Medication Management: Continue prophylaxis with monthly injectable MAB:  Emgality  There is a high risk for medication side effects, which will require ongoing monitoring and dose adjustment. Medication side effect profile was reviewed in detail with patient. Medication safety and drug interactions reviewed on ERx.   Plan for occipital nerve block asap.   HEALTH EDUCATION/HEALTH LITERACY: PATIENT EDUCATION was provided. Reviewed the plan of care in detail. The benefits and risks of each of the above procedures, benefits and alternative options were reviewed with the patient. Patient Counseling: All the patient's questions and concerns were addressed to their stated satisfaction .    Continuity of care: I am planning to provided needed longitudinal care that are part of the ongoing, longitudinal care relationship related to a patient???s neurological symptoms and ongoing condition in collaboration with his CareTeam. The patient's care will require a physician-patient relationship that long-term helps the patient with improved care, reduced burden of symptoms, medication management and overall improvement in quality of life.     Barriers to Care: Access to care: Health literacy.      Return for Occipital nerve block asap. .     Subjective     SOURCE: The history is obtained from the patient who appears to be a reliable historian. Additional history is obtained from review of available medical records. Open-ended and close-ended questions, as well as review of EPIC chart, including CareEverywhere if available.    History of Present Illness:  [name] is a 42 y.o.  male seen in followup for ongoing evaluation and management of chronic migraine and cervicalgia. Sometimes last a few days. None as severe in the last 2 months.  He is on [employer]  No side effects.   Multiple ED visits for headache prior to last visit. None since the last visit. Some aching or throbbing in the back of the head. He recalls this in the past- had occipital nerve block with improvement.   No migraines. Feels that he would benefit from a nerve block again.     Medication History:   Also on Topiramate 100 MG BID- no sure of adherence.    Botox in the past  Emgality: April 24, 2019  resumed [place], since he had done really well on Emgality, with almost no headaches, until he moved to [place] and did not receive it for 7-8 months while there. EMGALITY approved 01/18/2019 through 04/23/2019.Got the injection last month.     Abortive:   BC  Excedrin  Rizatriptan helps some.   Propanolol.     Objective     GENERAL PHYSICAL EXAM:    Vital Signs: Not available for review due to Video Telehealth visit.   Ht 175.3 cm (5' 9)  - Wt 96.6 kg (213 lb)  - BMI 31.45 kg/m??  Estimated body mass index is 31.45 kg/m?? as calculated from the following:    Height as of this encounter: 175.3 cm (5' 9).    Weight as of this encounter: 96.6 kg (213 lb).  Facility age limit for growth %iles is 20 years.    Wt Readings from Last 8 Encounters:   07/05/23 96.6 kg (213 lb)   04/19/23 96.6 kg (213 lb)   03/31/23 96.6 kg (213 lb)   01/27/23 94.8 kg (209 lb)   12/23/22 91.9 kg (202 lb 9.6 oz)   12/19/22 90.7 kg (200 lb)   12/02/22 91.6 kg (202 lb)   12/01/22 90.1 kg (198 lb 9.6 oz)     General Comments: The patient participated fully in the visit.     NEUROLOGICAL EXAMINATION   Mental Status  Speech and language were normal.    Diagnostic Studies and Review of Records:   I have independently reviewed the studies as noted. Medical Records: reviewed including multiple ED visits.  Brain Imaging: CT in 2015. Report reviewed.    IMPRESSION: No evidence of acute intracranial pathology.    The patient reports they are physically located in [place] and is currently: not at home. I conducted a phone visit.  I spent 10 minutes on the phone call with the patient on the date of service .  The patient was unable to connect on video.    Dr. Hollie Beach  CC: Gladman, Wilhemena Durie, MD  Dictated using voice-activated software. Despite editing, typographical errors may exist. conducted a phone visit.  I spent 8 minutes on the phone call with the patient on the date of service .  The patient was unable to connect on video.    Dr. Hollie Beach  CC: Gladman, Wilhemena Durie, MD  Dictated using voice-activated software. Despite editing, typographical errors may exist.

## 2023-07-05 NOTE — Unmapped (Signed)
[facility] Internal Medicine at [facility]     Reason for visit: Neurology Follow Up    Questions / Concerns that need to be addressed: no concerns      Allergies reviewed: Yes    Medication reviewed: Yes  Pended refills? Yes        HCDM reviewed and updated in Epic:    We are working to make sure all of our patients??? wishes are updated in Epic and part of that is documenting a Environmental health practitioner for each patient  A Health Care Decision Maker is someone you choose who can make health care decisions for you if you are not able - who would you most want to do this for you????  is already up to date.            Immunization History   Administered Date(s) Administered    COVID-19 VACC,MRNA,(PFIZER)(PF) 05/08/2020    DTaP 05/06/2012    Hepatitis B Vaccine, Unspecified Formulation 10/04/1994, 12/15/1994, 04/19/1995    INFLUENZA TIV (TRI) PF (IM) 10/31/2007    Influenza Vaccine Quad(IM)6 MO-Adult(PF) 01/01/2016, 10/21/2016, 12/02/2022    Influenza Virus Vaccine, unspecified formulation 08/19/2017, 10/20/2018    MMR 12/15/1994    PNEUMOCOCCAL POLYSACCHARIDE 23-VALENT 01/01/2016    Pneumococcal Conjugate 20-valent 12/23/2022    TD(TDVAX),ADSORBED,2LF(IM)(PF) 04/19/1995, 02/19/2002    TdaP 02/16/2017       __________________________________________________________________________________________    SCREENINGS COMPLETED IN FLOWSHEETS    HARK Screening       AUDIT       PHQ2       PHQ9          Link for Multi-language PHQ/GAD screeners: [email or web address]    P4 Suicidality Screener                GAD7       COPD Assessment       Falls Risk       .imcres

## 2023-07-12 ENCOUNTER — Ambulatory Visit: Admit: 2023-07-12 | Payer: MEDICARE | Attending: Neurology | Primary: Neurology

## 2023-07-13 NOTE — Unmapped (Signed)
Pt did not keep appointment. Unable to reach him. Will offer reschedule. This encounter was created in error - please disregard.

## 2023-07-21 ENCOUNTER — Ambulatory Visit
Admit: 2023-07-21 | Payer: MEDICARE | Attending: Rehabilitative and Restorative Service Providers" | Primary: Rehabilitative and Restorative Service Providers"

## 2023-07-21 NOTE — Unmapped (Signed)
[facility] Physical [facility]   [facility]  [address]                                 [place], [place] [address]    [phone]    Glenn Kennedy did not show for their scheduled physical therapy Evaluation. Patient did not notify us prior to missing our scheduled appointment.     Please contact me if you have any questions or concerns.      Thank you for this referral,     Signed: Army Chaco, PT  07/21/2023 1:44 PM

## 2023-07-21 NOTE — Unmapped (Unsigned)
[facility] PT [facility]  OUTPATIENT PHYSICAL THERAPY  07/21/2023          Patient Name: Glenn Kennedy  Date of Birth:[birth year]  Diagnosis: No diagnosis found.  Referring Provider: Roma Schanz    Date of Onset of Impairment: No date available  Date PT Care Plan Established or Reviewed: No date available  Date PT Treatment Started: No date available     Plan of Care Effective Date:   Session Number:  1    ASSESSMENT & PLAN   Assessment  Assessment details:      Glenn Kennedy is a pleasant 42 y.o. male who presents for Physical Therapy Evaluation with Chronic neck and B shoulder pain ongoing for years with no specific MOI. Primary impairments include difficulty lifting, overhead movements, pushing, pulling, household chores, and work duties. Clinical presentation today is consistent with B shoulder pain due to arthritis, neck pain with mobility deficits and weakness.  The patient will benefit from skilled Physical Therapy intervention to address the moderate impairments listed below and to assist the patient in maximizing his functional independence and safe return to prior level of function.             Impairments: decreased endurance, pain, decreased strength, decreased range of motion and impaired motor control      Personal Factors/Comorbidities: 3+    Specific Comorbidities: Chronic migraine without aura without status migrainosus, not intractable Hypertension Neck pain, chronic    Examination of Body Systems: musculoskeletal, activity/participation and communication    Clinical Presentation: stable    Clinical Decision Making: low    Prognosis: good prognosis    Negative Prognosis Rationale: medical status/condition, chronicity of condition, severity of symptoms, ADL performance, endurance and strength.      Therapy Goals      Goals:        1. In 12 weeks the patient will demonstrate independent performance of HEP to maintain functional gains.   2.in 12 week, patient will lift 5# to with L shoulder 10 times to be able to lift gallon of milk up to the counter at home.  3. In 12 weeks the patient will score a 10 point change on the NDI to demonstrate the MDC for non-specific neck pain (0-100%; lower score indicates a lesser level or disability) and to indicate improved activity tolerance.   4. In 12 weeks, patient will improve shoulder range of motion to full without increase in symptoms for safe return to workout regimen.        Plan    Therapy options: will be seen for skilled physical therapy services    Planned therapy interventions: Balance Training, Education - Patient, Endurance Activites, Functional Mobility, Home Exercise Program, Education - Family/Caregiver, E-Stim, [employer], Civil engineer, contracting, Diaphragmatic/Pursed-lip Breathing, 97113-Aquatic Therapy, 97112-Neuromuscular Re-education, 97110-Therapeutic Exercises, 97116-Gait Training, 97140-Manual Therapy, 97530-Therapeutic Activities, 97535-Self-Care/Home Training, 97750-Physical Performance Test and 16109, 20561-Dry Needling 1-2, 3+ areas    DME Equipment: [employer].    Frequency: 1x week    Duration in weeks: 12    Education provided to: patient.    Education provided: HEP, Treatment options and plan, Symptom management, Safety education, Importance of Therapy, Anatomy, Body mechanics, Role of therapy in Rehabilitation, Posture, Community resources and Body awareness    Education results: verbalized good understanding, demonstrates understanding and needs reinforcement.    Communication/Consultation: Medicare Cert/POC sent to Referring Provider.              SUBJECTIVE  History of Present Condition     Date of onset:  01/19/2023 (Chronic)    History of Present Condition/Chief Complaint:  Associated Diagnoses    Neck pain, chronic [M54.2, G89.29]        Subjective:  Neck and Bilateral shoulder pain.  Associated with his migraines.  Discussed physical therapy at home and elected to refer to physical therapy in person for his history of bilateral shoulder arthritis.  reducing frequency of his migraines now once weekly compared to it previously being 4-5 times weekly.  He does continue to have bilateral shoulder pain which is chronic and that he feels associated with his migraines however.  The pain starts in his shoulders and over a few hours it then progresses to the back of his scalp and his occipital region.  This seems to provoke his migraines and he has worked to massage out the pain and do exercises to help with the arthritis pain.    Pain:   Location:  B shoulders    Quality:  Aching    Relieving factors:  Rest    Aggravating factors:  Performance of arm dominant activites, overhead activity, lifting, pushing, reaching and pulling    Pain related Behaviors:  Avoidance    Progression:  No change    Red Flags:  None    Precautions:  None    Current Braces/Orthoses:  None    Equipment Currently Used:  None    No physical limitations    Current Functional Status:  Limited exercise, limited lifting, limited recreation, limited household activities and limited work capacity  Barriers to Learning:  No Barriers  Diagnostic Tests:     Comments:  No recent imaging  Treatments:     None      Current treatment: physical therapy    Patient Goals:     Patient/Family goals for therapy:  Decreased pain, increased ROM, increased strength and independence with ADLs/IADLs      Hunger vital sign:  1. Within the past 12 months, we worried whether our food would run out before we got money to buy more. Never True  2. Within the past 12 months, the food we bought just didn't last, and we didn't have money to get more. Never True    If patient identifies with either of the above, patient was provided with local food resource guide and non perishables when possible.    OBJECTIVE       Functional Test/Outcome Measures:  NDI:     Posture/Observations:   Sitting: rounded shoulders, foward head and increased thoracic kyphosis  Standing: foward flexed posture, rounded shoulders, foward head and increased thoracic kyphosis    Range of Motion/Flexibilty:   Motion ROM- symptoms   Flexion    Extension    Lateral flexion R    Lateral flexion L    Rotation R    Rotation L    Protraction    Retraction      Special Tests:      Spurlings Negative   ULTT A- medial nerve Negative   Distraction Negative   Alar ligament Negative   Sharp purser Negative   VBI Negative   Flexion rotation  Negative   Craniocervical flexion Negative   Neck flexor endurance Negative             Shoulder AROM:  Flexion:  R: L:  Abduction:  R:  L:  Functional IR  R:  L:  Functional ER: R: L:    NEUROLOGICAL:  MOTOR STRENGTH  Muscle Right Left   C4 Upper Trap 4-/5 4-/5   C5 Deltoid 4-/5 4-/5   C6 Bicep 4-/5 4-/5   C7 Tricep 4-/5 4-/5   C7 Wrist Extension [facility] WFL   C8 Abductor digiti quinti WFL WFL   T1 interosei dorsalis WFL WFL   Grip WFL WFL     SENSATION - LIGHT TOUCH  Dermatome Right Left   C2 Intact Intact   C3 Intact Intact   C4 Intact Intact   C5 Intact Intact   C6 Intact Intact   C7 Intact Intact   C8 Intact Intact   T1 Intact Intact               - Mobility:       TREATMENT RENDERED     Interpreter Use: Not applicable    Physical Therapy Evaluation - Mod Complexity     Therapeutic Exercise:  15 Minutes   Performed with direct PT demonstration, instruction, supervision, and guidance.   - Education on condition, prognosis, and PT POC  - HEP review as below       Manual Therapy: 0 Minutes   -         Next Visit Plan: Progress cervical/strengthening mobility and strengthening as tolerated      Total Treatment Time: 45 Minutes                          I attest that I have reviewed the above information.  Signed: Army Chaco, PT, DPT  07/21/2023 7:11 AM        I reviewed the no-show/attendance policy with the patient and caregiver(s). The patient is aware that they must call to cancel appointments more than 24 hours in advance. They are also aware that if they late cancel or no-show three times, we reserve the right to cancel their remaining appointments. This policy is in place to allow [place] to best serve the needs of our caseload.    If patient returns to clinic with variance in plan of care, then it may be attributable to one or more of the following factors: preferred clinician availability, appointment time request availability, therapy pool appointment availability, major holiday with clinic closure, caregiver availability, patient transportation, conflicting medical appointment, inclement weather, and/or patient illness.    If patient does not return for follow up visit(s) related to this episode of care, this note will serve as their discharge note from Physical Therapy.

## 2023-07-27 NOTE — Unmapped (Signed)
[facility] Specialty Medication Onboarding    Specialty Medication: Merchant navy officer  Prior Authorization: Not Required   Financial Assistance: No - copay  <$25  Final Copay/Day Supply: $4.60 / 30    Insurance Restrictions: None     Notes to Pharmacist: None  Credit Card on File: no    The triage team has completed the benefits investigation and has determined that the patient is able to fill this medication at [facility]. Please contact the patient to complete the onboarding or follow up with the prescribing physician as needed.

## 2023-07-28 NOTE — Unmapped (Signed)
The [facility] has reached out to this patient via MyChart to onboard them to our Specialty Lite services for their Emgality. Their medication was last delivered on 04/25/23.They will now receive proactive outreach from the pharmacy team for refills.    Arnold Long, PharmD  [facility] Specialty and Home Delivery Pharmacist

## 2023-08-09 ENCOUNTER — Ambulatory Visit: Admit: 2023-08-09 | Discharge: 2023-08-10 | Payer: MEDICARE | Attending: Neurology | Primary: Neurology

## 2023-08-09 DIAGNOSIS — M5481 Occipital neuralgia: Principal | ICD-10-CM

## 2023-08-09 DIAGNOSIS — I10 Essential (primary) hypertension: Principal | ICD-10-CM

## 2023-08-09 DIAGNOSIS — G43709 Chronic migraine without aura, not intractable, without status migrainosus: Principal | ICD-10-CM

## 2023-08-09 DIAGNOSIS — M542 Cervicalgia: Principal | ICD-10-CM

## 2023-08-09 DIAGNOSIS — G8929 Other chronic pain: Principal | ICD-10-CM

## 2023-08-09 MED ORDER — AMLODIPINE 10 MG-VALSARTAN 160 MG TABLET
ORAL_TABLET | Freq: Every day | ORAL | 3 refills | 90 days | Status: CP
Start: 2023-08-09 — End: ?

## 2023-08-09 MED ORDER — EMGALITY PEN 120 MG/ML SUBCUTANEOUS PEN INJECTOR
SUBCUTANEOUS | 11 refills | 30 days | Status: CP
Start: 2023-08-09 — End: 2024-08-03

## 2023-08-09 MED ORDER — RIZATRIPTAN 10 MG DISINTEGRATING TABLET
ORAL_TABLET | Freq: Once | ORAL | 3 refills | 45 days | Status: CP | PRN
Start: 2023-08-09 — End: 2024-08-08

## 2023-08-09 MED ADMIN — bupivacaine (PF) (MARCAINE) 0.5 % (5 mg/mL) injection (PF) 3 mL: 3 mL | @ 19:00:00 | Stop: 2023-08-09

## 2023-08-09 MED ADMIN — methylPREDNISolone acetate (DEPO-Medrol) injection 80 mg: 80 mg | INTRAMUSCULAR | @ 19:00:00 | Stop: 2023-08-09

## 2023-08-09 NOTE — Unmapped (Signed)
NEUROLOGY REPORT    [address]  [facility] INTERNAL MEDICINE [address] [place]  [address]  [place] [address]  [phone]    Prior visit with this Neurologist: 07/05/2023; 04/19/2023    Assessment/Plan:     Glenn Kennedy is a 42 y.o. male  who presents for evaluation and management with medication management of Chronic migraine without aura without status migrainosus.  He has previously done really well with prophylaxis on galcanezumab.  His headache frequency has reduced since resuming Emgality.  However,  continues to report neck pain- previously resolved with occipital nerve block which he requests again today. HIT 6 is 49.       PLAN:    Medication Management: Continue prophylaxis with monthly injectable MAB:  Emgality- refills sent in.   There is a high risk for medication side effects, which will require ongoing monitoring and dose adjustment. Medication side effect profile was reviewed in detail with patient. Medication safety and drug interactions reviewed on ERx.   HEALTH EDUCATION/HEALTH LITERACY: PATIENT EDUCATION was provided. Reviewed the plan of care in detail. The benefits and risks of each of the above procedures, benefits and alternative options were reviewed with the patient. Patient Counseling: All the patient's questions and concerns were addressed to their stated satisfaction .    If no improvement, consider PT for cervical paraspinous spasm and cervicalgia.   Courtesy refills on antihypertensive medication.     Barriers to Care: Access to care: Health literacy.      Return for Video OK, As needed in the future, Care is transferred back to PCP.Marland Kitchen     Subjective     SOURCE: The history is obtained from the patient who appears to be a reliable historian. Additional history is obtained from review of available medical records. Open-ended and close-ended questions, as well as review of EPIC chart, including CareEverywhere if available.    History of Present Illness:  Glenn Kennedy is a 42 y.o.  male seen in followup for ongoing evaluation and management of chronic migraine and cervicalgia. Sometimes last a few days. None as severe in the last 2 months.  He is on [employer]  No side effects.   Multiple ED visits for headache prior to last visit. None since the last visit. Some aching or throbbing in the back of the head. He recalls this in the past- had occipital nerve block with improvement (~2017). Feels that he would benefit from a nerve block again.   No migraines. No problems with EMGALITY.     Medication History:   Also on Topiramate 100 MG BID- no sure of adherence.    Botox in the past- ? Improvement.   Emgality: April 24, 2019  resumed [place], since he had done really well on Emgality, with almost no headaches, until he moved to [place] and did not receive it for 7-8 months while there. EMGALITY approved 01/18/2019 through 04/23/2019.Got the injection last month.   Occipital nerve block- very effective.     Abortive:   BC  Excedrin  Rizatriptan helps some.   Propanolol.     Objective     GENERAL PHYSICAL EXAM:    Vital Signs:   BP 137/101 (BP Site: L Arm, BP Position: Sitting, BP Cuff Size: Medium)  - Pulse 83  - Temp 36.3 ??C (97.3 ??F) (Temporal)  - Resp 16  - Ht 175.3 cm (5' 9)  - Wt 97.2 kg (214 lb 3.2 oz)  - SpO2 96%  -  BMI 31.63 kg/m??   Estimated body mass index is 31.63 kg/m?? as calculated from the following:    Height as of this encounter: 175.3 cm (5' 9).    Weight as of this encounter: 97.2 kg (214 lb 3.2 oz).  Facility age limit for growth %iles is 20 years.    Wt Readings from Last 8 Encounters:   08/09/23 97.2 kg (214 lb 3.2 oz)   07/05/23 96.6 kg (213 lb)   04/19/23 96.6 kg (213 lb)   03/31/23 96.6 kg (213 lb)   01/27/23 94.8 kg (209 lb)   12/23/22 91.9 kg (202 lb 9.6 oz)   12/19/22 90.7 kg (200 lb)   12/02/22 91.6 kg (202 lb)     General Comments: The patient participated fully in the visit.     NEUROLOGICAL EXAMINATION   Mental Status  Speech and language were normal.    PROCEDURE NOTE: OCCIPITAL NERVE BLOCK    Indication: Occipital Neuralgia     We have discussed repeating the occipital nerve block which the patient would like to pursue.   Prior to initiation of procedure, proper consent was obtained from the patient after discussing risks and benefits of the procedure.     [facility] Internal Medicine ClinicTime Out    [x]  Verification of correct patient, using 2 patient identifiers  [x]  Verification of correct procedure and correct site confirmed by review of medical documentation AND through verification/review with patient or responsible care giver.  [x]  Verification that all relevant documents and studies (history and physical, laboratory, pathology, radiology, etc.) are available prior to start of procedure and are matched with patient identifiers (same patient name, date of birth, medical record number).  [x]  Presence of accurately completed and signed procedure consent form.  [x]  Verification that required items for procedure (blood products, implants, devices, special equipment) are available.  [x]  Correct patient position.  [x]  Safety precautions based on patient history or medication use.  [x]  Confirmation that expiration date for items is on or after the procedure date.      Risks were including but not limited to infection and bleeding at local sites and distal to site of injections.   Patient agreed to the procedure after thorough discussion.    Using palpation and pain response, target areas in the suboccipital region, including the occipital nerve region bilaterally were identified. A timeout was performed immediately prior to the procedure, identifying patient, and location of injections.    Patient was kept in the sitting position, the occipital groove was properly identified on each side and pressure applied to determine whether reproduction of pain symptoms would occur on palpation. In this patient's case, applied pressure reproduced faithfully the symptoms he described as painful.     After proper identification of the tract of the greater occipital nerve, the sites were thoroughly and carefully cleaned with alcohol swabs in usual fashion.    A solution containing of 0.5% Sensorcaine, and 1.0 cc of DepoMedrol (80 mg) for a total of was prepared in a 5 ML syringe.     2.5 mL was applied at each of 1 site at the point of most pain, along the tract of the greater occipital nerve.    Procedure was performed bilaterally.    Patient tolerated procedure well. Minimal bleeding occurred which was easily stopped with applying local pressure. Pressure was applied to each of the injection sites with the intent of spreading the solution under the scalp.    Complications: There were no  immediate complications from the procedure. Patient was informed that she should avoid washing site of injections for approximately 4 hours, and thereafter should maintain his  hair clean in order to avoid local site infections.    .    Dr. Hollie Beach  CC: Gladman, Wilhemena Durie, MD  Dictated using voice-activated software. Despite editing, typographical errors may exist.

## 2023-08-09 NOTE — Unmapped (Addendum)
It has been my pleasure to participate in your care. Here is a brief summary of our discussion today:    You received a bilateral occipital nerve block today. Please keep the area dry and clean. Avoid picking at any scabs that may form over the needle site.   You may experience numbness which is expected.   Your medications requested were sent to your pharmacy [facility]) as requested.     Please note: All results will be released immediately in MyChart. Please allow 2 days for me to review them and send an interpretation.  Do not use MyChart for urgent issues.       Warmest Regards,    Dr. Hollie Beach  Board Certified: Adult Neurology & Vascular Neurology  [facility] Department of Internal Medicine  [facility] Department of Neurology  [place], [place]      Frequently Requested Information:    Internal Medicine Clinic:    [phone]   Fax: [phone]  Geriatric Specialty Clinic:  [phone]   Fax: [phone]   After Hours: [phone].      Need help paying for medications, the cost of care or a procedure estimate?   [facility] Financial Assistance Estimates  [phone]- option 3 Estimates Help - [facility] (unchealthcare.org)     Scheduling Tests?  Please choose ONLY those appropriate for you.   MRI scan, CT scan, DAT/PET Scan:  [phone]  EMG/EEG/Evoked Potentials/Autonomic Testing: [phone]  (DONE AT [facility] MAIN CAMPUS ONLY)  Sleep Study: [phone]   Memory/Cognitive testing: [phone]   Myelogram CT: There are 2 steps for this test that must be scheduled in this sequence: FIRST: Myelogram: [phone], THEN schedule CT (ideally same day) [phone] option 2    Medication Assistance:  [facility] ships directly to your home [phone]. Refills: [phone]  Option 1  Automated Touchtone Prescription Refill System.   Option 2  Speak with a Representative (M-F 8-4:30 PM)  Other ways to lower your cost:   GoodRx.com has coupon codes for medications  Costplusdrugs.com  Pharmacy.amazon.com  Co-Pay card may lower your out-of-pocket cost. These are typically via Manufacturer so search the website for the medication.     LEARN MORE ABOUT YOUR BRAIN and NEUROLOGICAL HEALTH:   Regular issue from American Academy of Neurology:www.brainandlife.org  Stroke Recovery and Support (RingtoneFundraiser.se)  Learn more about memory disorders.   Maintaining your brain health: TonerProviders.gl  Reducing your risk for dementia: http://www.smith-williams.com/  RESEARCH OPPORTUNITIES: research.http://herrera-sanchez.net/      Thank you for choosing  Health: Changing Lives for the Better.

## 2023-08-09 NOTE — Unmapped (Signed)
[facility] Internal Medicine at [facility]     Reason for visit: Nerve Blocks    Questions / Concerns that need to be addressed: refills          Allergies reviewed: Yes    Medication reviewed: Yes  Pended refills? No        HCDM reviewed and updated in Epic:    We are working to make sure all of our patients??? wishes are updated in Epic and part of that is documenting a Environmental health practitioner for each patient  A Health Care Decision Maker is someone you choose who can make health care decisions for you if you are not able - who would you most want to do this for you????  is already up to date.          Immunization History   Administered Date(s) Administered    COVID-19 VACC,MRNA,(PFIZER)(PF) 05/08/2020    DTaP 05/06/2012    Hepatitis B Vaccine, Unspecified Formulation 10/04/1994, 12/15/1994, 04/19/1995    INFLUENZA TIV (TRI) PF (IM) 10/31/2007    Influenza Vaccine Quad(IM)6 MO-Adult(PF) 01/01/2016, 10/21/2016, 12/02/2022    Influenza Virus Vaccine, unspecified formulation 08/19/2017, 10/20/2018    MMR 12/15/1994    PNEUMOCOCCAL POLYSACCHARIDE 23-VALENT 01/01/2016    Pneumococcal Conjugate 20-valent 12/23/2022    TD(TDVAX),ADSORBED,2LF(IM)(PF) 04/19/1995, 02/19/2002    TdaP 02/16/2017       __________________________________________________________________________________________    SCREENINGS COMPLETED IN FLOWSHEETS    HARK Screening       AUDIT       PHQ2       PHQ9          Link for Multi-language PHQ/GAD screeners: https://www.phqscreeners.com/select-[email or web address]    P4 Suicidality Screener                GAD7       COPD Assessment       Falls Risk       .imcres

## 2023-08-11 ENCOUNTER — Ambulatory Visit
Admit: 2023-08-11 | Discharge: 2023-08-11 | Disposition: A | Discharge status: Home / Self Care | Payer: MEDICARE | Attending: Family

## 2023-08-11 DIAGNOSIS — R519 Nonintractable headache, unspecified chronicity pattern, unspecified headache type: Principal | ICD-10-CM

## 2023-08-11 LAB — BASIC METABOLIC PANEL
ANION GAP: 6 mmol/L (ref 5–14)
BLOOD UREA NITROGEN: 13 mg/dL (ref 9–23)
BUN / CREAT RATIO: 12
CALCIUM: 9.5 mg/dL (ref 8.7–10.4)
CHLORIDE: 113 mmol/L — ABNORMAL HIGH (ref 98–107)
CO2: 25.2 mmol/L (ref 20.0–31.0)
CREATININE: 1.07 mg/dL
EGFR CKD-EPI (2021) MALE: 89 mL/min/{1.73_m2} (ref >=60)
GLUCOSE RANDOM: 113 mg/dL (ref 70–179)
POTASSIUM: 4.2 mmol/L (ref 3.4–4.8)
SODIUM: 144 mmol/L (ref 135–145)

## 2023-08-11 LAB — CBC W/ AUTO DIFF
BASOPHILS ABSOLUTE COUNT: 0.1 10*9/L (ref 0.0–0.1)
BASOPHILS RELATIVE PERCENT: 1 %
EOSINOPHILS ABSOLUTE COUNT: 0.3 10*9/L (ref 0.0–0.5)
EOSINOPHILS RELATIVE PERCENT: 2.3 %
HEMATOCRIT: 41.5 % (ref 39.0–48.0)
HEMOGLOBIN: 14.4 g/dL (ref 12.9–16.5)
LYMPHOCYTES ABSOLUTE COUNT: 3.2 10*9/L (ref 1.1–3.6)
LYMPHOCYTES RELATIVE PERCENT: 27.6 %
MEAN CORPUSCULAR HEMOGLOBIN CONC: 34.6 g/dL (ref 32.0–36.0)
MEAN CORPUSCULAR HEMOGLOBIN: 31.6 pg (ref 25.9–32.4)
MEAN CORPUSCULAR VOLUME: 91.3 fL (ref 77.6–95.7)
MEAN PLATELET VOLUME: 9.1 fL (ref 6.8–10.7)
MONOCYTES ABSOLUTE COUNT: 0.4 10*9/L (ref 0.3–0.8)
MONOCYTES RELATIVE PERCENT: 3.8 %
NEUTROPHILS ABSOLUTE COUNT: 7.6 10*9/L (ref 1.8–7.8)
NEUTROPHILS RELATIVE PERCENT: 65.3 %
NUCLEATED RED BLOOD CELLS: 0 /100{WBCs} (ref <=4)
PLATELET COUNT: 187 10*9/L (ref 150–450)
RED BLOOD CELL COUNT: 4.55 10*12/L (ref 4.26–5.60)
RED CELL DISTRIBUTION WIDTH: 13.3 % (ref 12.2–15.2)
WBC ADJUSTED: 11.6 10*9/L — ABNORMAL HIGH (ref 3.6–11.2)

## 2023-08-11 MED ADMIN — metoclopramide (REGLAN) injection 10 mg: 10 mg | INTRAVENOUS | @ 23:00:00 | Stop: 2023-08-11

## 2023-08-11 MED ADMIN — lactated ringers bolus 1,000 mL: 1000 mL | INTRAVENOUS | @ 23:00:00 | Stop: 2023-08-11

## 2023-08-11 MED ADMIN — diphenhydrAMINE (BENADRYL) injection 25 mg: 25 mg | INTRAVENOUS | @ 23:00:00 | Stop: 2023-08-11

## 2023-08-11 MED ADMIN — ondansetron (ZOFRAN) injection 4 mg: 4 mg | INTRAVENOUS | @ 23:00:00 | Stop: 2023-08-11

## 2023-08-12 NOTE — Unmapped (Signed)
[facility]  Emergency Department Provider Note     ED Clinical Impression     Final diagnoses:   Nonintractable headache, unspecified chronicity pattern, unspecified headache type (Primary)      Impression, Medical Decision Making, ED Course     Impression: 42 y.o. male who has a past medical history of Hypertension, Migraines, and Neck pain, chronic. who presents with for evaluation of a migraine.  DDx/MDM:     Regarding the patient's headache, I have a low suspicion for subarachnoid hemorrhage given the fact that the patient's headache was not thunderclap in onset and is not the most severe headache of the patient's life. Furthermore, the patient has no neurologic deficits. Low suspicion for CVA given the lack of focal neurologic deficit. I have a low suspicion for CNS infection given the lack of nuchal rigidity, meningeal findings or infectious symptoms.     Diagnostic workup as below. Will treat patient with IV cocktail to include Zofran, Reglan, diphenhydramine..    Orders Placed This Encounter   Procedures    CBC w/ Differential    Basic Metabolic Panel    Insert peripheral IV            Strict return precautions were reviewed with the patient. The patient was advised to return to the emergency department with any troubling symptoms. I advised the patient to follow up with his primary care doctor within one week for re-evaluation and to assure improvement in symptoms. The patient is comfortable with this plan and will be discharged.    History     Chief Complaint  Chief Complaint   Patient presents with    Head Pain       HPI   Igor Ederson Shiao is a 42 y.o. male with past medical history as below who presents with migraine that is refractory to his normal migraine medication.        Past Medical History:   Diagnosis Date    Hypertension     Migraines     Neck pain, chronic        No past surgical history on file.    No current facility-administered medications for this encounter.    Current Outpatient Medications:     amlodipine-valsartan (EXFORGE) 10-160 mg per tablet, Take 1 tablet by mouth daily., Disp: 90 tablet, Rfl: 3    galcanezumab-gnlm (EMGALITY PEN) 120 mg/mL injection, Inject the contents of 1 pen (120 mg) under the skin every thirty (30) days., Disp: 1 mL, Rfl: 11    propranolol (INDERAL) 80 MG tablet, Take 1 tablet (80 mg total) by mouth two (2) times a day., Disp: 90 tablet, Rfl: 3    rizatriptan (MAXALT-MLT) 10 MG disintegrating tablet, Dissolve 1 tablet (10 mg total) on the tongue once as needed for migraine as directed., Disp: 27 tablet, Rfl: 3    rosuvastatin (CRESTOR) 5 MG tablet, Take 1 tablet (5 mg total) by mouth nightly., Disp: 90 tablet, Rfl: 3    Allergies  Patient has no known allergies.    Family History  Family History   Problem Relation Age of Onset    Stroke Mother 77        hemorrhagic    Hyperlipidemia Mother     Hypertension Mother     Migraines Mother     No Known Problems Father     No Known Problems Brother     Migraines Son     No Known Problems Maternal Grandmother     No  Known Problems Maternal Grandfather        Social History  Social History     Tobacco Use    Smoking status: Every Day     Current packs/day: 0.50     Average packs/day: 0.5 packs/day for 26.2 years (13.1 ttl pk-yrs)     Types: Cigarettes     Start date: 06/01/1997    Smokeless tobacco: Never   Substance Use Topics    Alcohol use: No     Alcohol/week: 0.0 standard drinks of alcohol    Drug use: No        Physical Exam     VITAL SIGNS:      Vitals:    08/11/23 1739   BP: 148/105   Pulse: 93   Resp: 20   Temp: 36.6 ??C (97.9 ??F)   SpO2: 96%       Constitutional: Alert and oriented. No acute distress.  Eyes: Conjunctivae are normal.  HEENT: Normocephalic and atraumatic. Conjunctivae clear. No congestion. Moist mucous membranes.   Cardiovascular: Rate as above, regular rhythm. Normal and symmetric distal pulses. Brisk capillary refill. Normal skin turgor.  Respiratory: Normal respiratory effort. Breath sounds are normal. There are no wheezing or crackles heard.  Gastrointestinal: Soft, non-distended, non-tender.  Genitourinary: Deferred.  Musculoskeletal: Non-tender with normal range of motion in all extremities.  Neurologic: Normal speech and language. No gross focal neurologic deficits are appreciated. Patient is moving all extremities equally, face is symmetric at rest and with speech.  Skin: Skin is warm, dry and intact. No rash noted.  Psychiatric: Mood and affect are normal. Speech and behavior are normal.     Radiology     No orders to display       Pertinent labs & imaging results that were available during my care of the patient were independently interpreted by me and considered in my medical decision making (see chart for details).    Portions of this record have been created using Scientist, clinical (histocompatibility and immunogenetics). Dictation errors have been sought, but may not have been identified and corrected.         Loman Brooklyn, FNP  08/11/23 1948

## 2023-08-12 NOTE — Unmapped (Signed)
Pt arrives with posterior headache since yesterday.  Pt states he took his normal medications and Excedrin for the head pain without resolution.

## 2023-11-21 ENCOUNTER — Emergency Department: Admit: 2023-11-21 | Discharge: 2023-11-21 | Discharge status: Against Medical Advice | Payer: MEDICARE

## 2023-11-21 NOTE — Unmapped (Signed)
Patient ambulatory to triage. Reports ongoing headache with light sensitivity. Took prescribed medications for pain but didn't help per patient.

## 2023-12-04 NOTE — Unmapped (Signed)
LWBS/AMA Patient Follow-Up Call  I attempted to call patient at [phone] due to recent LWBS after triage from the ED. Patient did not answer at listed phone number at this time. Generic voicemail left with call back number.

## 2024-03-11 ENCOUNTER — Emergency Department: Admit: 2024-03-11 | Discharge: 2024-03-12 | Disposition: A | Discharge status: Home / Self Care | Payer: MEDICARE

## 2024-03-11 DIAGNOSIS — R519 Nonintractable headache, unspecified chronicity pattern, unspecified headache type: Principal | ICD-10-CM

## 2024-03-11 MED ADMIN — diphenhydrAMINE (BENADRYL) injection 25 mg: 25 mg | INTRAVENOUS | @ 23:00:00 | Stop: 2024-03-11

## 2024-03-11 MED ADMIN — sodium chloride 0.9% (NS) bolus 1,000 mL: 1000 mL | INTRAVENOUS | @ 23:00:00 | Stop: 2024-03-11

## 2024-03-11 MED ADMIN — metoclopramide (REGLAN) injection 10 mg: 10 mg | INTRAVENOUS | @ 23:00:00 | Stop: 2024-03-11

## 2024-03-11 NOTE — Unmapped (Signed)
 Pt coming in POV for headaches. Pt states having headaches once in a while and that he felt one coming up yesterday. Today presents with throbbing headache. Hx of migraine cocktail helping.

## 2024-03-11 NOTE — Unmapped (Signed)
 [facility]  Emergency Department Provider Note      ED Clinical Impression      Final diagnoses:   Nonintractable headache, unspecified chronicity pattern, unspecified headache type (Primary)          Impression, Medical Decision Making, Progress Notes and Critical Care      Impression, Differential Diagnosis and Plan of Care      Patient is a 43 y.o. male with PMH of HTN and migraines presenting for a throbbing headache in the context of having chronic migraines and headaches with migraine cocktails relieving his symptoms.     On exam, patient is well-appearing and in no acute distress. Vital signs are normotensive, non tachycardic, afebrile, and saturating 99% on room air with no signs of any respiratory distress. Physical exam unremarkable.     Likely migraine.  Consider alternative diagnoses such as subarachnoid hemorrhage and meningitis.  Do not believe patient requires LP or CT imaging.    Will give migraine cocktail.    Additional Progress Notes    7:44 PM    Patient is feeling improved.  Will discharge home.      Portions of this record have been created using Scientist, clinical (histocompatibility and immunogenetics). Dictation errors have been sought, but may not have been identified and corrected.    See chart and resident provider documentation for details.    ____________________________________________         History        Reason for Visit  Headache Recurrent or Known Dx Migraines      HPI   Glenn Kennedy is a 43 y.o. male with a PMH of HTN and migraines who presents to the ED for recurrent headache. The patient reports a throbbing headache in the context of having chronic migraines and headaches. He took [facility] powder prior to arrival. He has a history of migraine cocktails relieving his symptoms.    Outside Historian(s)  (EMS, Significant Other, Family, Parent, Caregiver, Friend, Law Enforcement, etc.)    NA    External Records Reviewed  (Inpatient/Outpatient notes, Prior labs/imaging studies, Care Everywhere, PDMP, External ED notes, etc)    08/11/2023 ED Discharge note for PMH    Past Medical History:   Diagnosis Date    Hypertension     Migraines     Neck pain, chronic        Patient Active Problem List   Diagnosis    Chronic migraine without aura without status migrainosus, not intractable    Hypertension    Neck pain, chronic       No past surgical history on file.    No current facility-administered medications for this encounter.    Current Outpatient Medications:     amlodipine -valsartan  (EXFORGE ) 10-160 mg per tablet, Take 1 tablet by mouth daily., Disp: 90 tablet, Rfl: 3    galcanezumab -gnlm (EMGALITY  PEN) 120 mg/mL injection, Inject the contents of 1 pen (120 mg) under the skin every thirty (30) days., Disp: 1 mL, Rfl: 11    propranolol  (INDERAL ) 80 MG tablet, Take 1 tablet (80 mg total) by mouth two (2) times a day., Disp: 90 tablet, Rfl: 3    rizatriptan  (MAXALT -MLT) 10 MG disintegrating tablet, Dissolve 1 tablet (10 mg total) on the tongue once as needed for migraine as directed., Disp: 27 tablet, Rfl: 3    rosuvastatin  (CRESTOR ) 5 MG tablet, Take 1 tablet (5 mg total) by mouth nightly., Disp: 90 tablet, Rfl: 3    Allergies  Patient has  no known allergies.    Family History   Problem Relation Age of Onset    Stroke Mother 16        hemorrhagic    Hyperlipidemia Mother     Hypertension Mother     Migraines Mother     No Known Problems Father     No Known Problems Brother     Migraines Son     No Known Problems Maternal Grandmother     No Known Problems Maternal Grandfather        Social History  Social History     Tobacco Use    Smoking status: Every Day     Current packs/day: 0.50     Average packs/day: 0.5 packs/day for 26.8 years (13.4 ttl pk-yrs)     Types: Cigarettes     Start date: 06/01/1997    Smokeless tobacco: Never   Substance Use Topics    Alcohol use: No     Alcohol/week: 0.0 standard drinks of alcohol    Drug use: No          Physical Exam     This provider entered the patient's room: Yes:    If this provider did not enter the room, a comprehensive physical exam was not able to be performed due to increased infection risk to themselves, other providers, staff and other patients), as well as to conserve personal protective equipment (PPE) utilization during the COVID-19 pandemic.    If this provider did enter the patient room, the following was PPE worn: Surgical mask, eye protection and gloves     ED Triage Vitals [03/11/24 1759]   Enc Vitals Group      BP 165/98      Pulse 82      SpO2 Pulse       Resp 18      Temp 36.6 ??C (97.9 ??F)      Temp Source Temporal      SpO2 99 %      Weight       Height       Head Circumference       Peak Flow       Pain Score       Pain Loc       Pain Education       Exclude from Growth Chart        Constitutional: Alert and oriented. Well appearing and in no distress.  Eyes: Conjunctivae are normal.  ENT       Head: Normocephalic and atraumatic.       Nose: No congestion.       Mouth/Throat: Mucous membranes are moist.       Neck: No stridor.  Hematological/Lymphatic/Immunilogical: No cervical lymphadenopathy.  Cardiovascular: Normal rate, regular rhythm. Normal and symmetric distal pulses are present in all extremities.  Respiratory: Normal respiratory effort. Breath sounds are normal.  Gastrointestinal: Soft and nontender. There is no CVA tenderness.  Musculoskeletal: Normal range of motion in all extremities.       Right lower leg: No tenderness or edema.       Left lower leg: No tenderness or edema.  Neurologic: Normal speech and language. No gross focal neurologic deficits are appreciated.  Skin: Skin is warm, dry and intact. No rash noted.  Psychiatric: Mood and affect are normal. Speech and behavior are normal.      Documentation assistance was provided by Mackie Sayre, Scribe, on March 11, 2024 at 6:56 PM for Ronni Colace, NP.  March 11, 2024 7:44 PM. Documentation assistance provided by the scribe. I was present during the time the encounter was recorded. The information recorded by the scribe was done at my direction and has been reviewed and validated by me.        Glenn Kennedy, [place]  03/11/24 1946

## 2024-03-29 ENCOUNTER — Ambulatory Visit: Admit: 2024-03-29 | Payer: MEDICARE

## 2024-04-23 NOTE — Unmapped (Signed)
 Glenn Kennedy (KeyThomasine Flick)  Rx #: [id number]  Emgality  120MG /ML auto-injectors (migraine)  Form    2 minutes ago  Message from Plan  Request Reference Number: [id number]. EMGALITY  INJ 120MG /ML is approved through 11/20/2024. Your patient may now fill this prescription and it will be covered.. Authorization Expiration Date: November 20, 2024.

## 2024-04-29 DIAGNOSIS — M542 Cervicalgia: Principal | ICD-10-CM

## 2024-04-29 DIAGNOSIS — G43709 Chronic migraine without aura, not intractable, without status migrainosus: Principal | ICD-10-CM

## 2024-04-29 DIAGNOSIS — G8929 Other chronic pain: Principal | ICD-10-CM

## 2024-04-29 MED ORDER — RIZATRIPTAN 10 MG DISINTEGRATING TABLET
ORAL_TABLET | Freq: Once | ORAL | 3 refills | 45.00000 days | Status: CP | PRN
Start: 2024-04-29 — End: 2025-04-29

## 2024-04-29 NOTE — Unmapped (Signed)
 Refill request received, reviewed and completed. Followup is scheduled, as clinically appropriate, sooner PRN.

## 2024-04-30 NOTE — Unmapped (Signed)
 Called patient to schedule a return neurology appointment with Dr. Robyne Christen early Sept. 2025. Left voicemail and callback number

## 2024-07-25 NOTE — Unmapped (Signed)
 Pt c/o consistent headache x2 days. Took meds that usually works but no relief

## 2024-07-25 NOTE — Unmapped (Signed)
 [facility]  Emergency Department Provider Note      ED Clinical Impression     Final diagnoses:   Headache, unspecified headache type (Primary)       Initial Impression, ED Course, Assessment and Plan     Time seen: July 25, 2024 11:34 PM   Glenn Kennedy is a 43 y.o. male presenting with headache. The patient presents with acute headache in the setting of known migraines. Differential diagnosis included migraine exacerbation (most likely), viral illness-associated headache, tension headache, medication-related side effects, and less likely emergent processes such as meningitis or intracranial hemorrhage. The absence of fever, neck stiffness, altered mental status, neurologic deficits, or sudden severe onset makes meningitis or subarachnoid hemorrhage unlikely. No trauma or anticoagulation history to suggest other acute intracranial pathology. Vitals were stable, and exam showed photophobia but no focal neurological deficits. Given history and exam, no emergent neuroimaging or lumbar puncture indicated at this time.    The patient was treated with a standard migraine cocktail including Compazine , Toradol , and Benadryl , with symptomatic improvement on reassessment. He remained stable in the ED without new or progressive symptoms. Given improvement, no acute neurological red flags, and low suspicion for secondary headache, the patient is appropriate for discharge. He was advised to follow up with his primary care physician to discuss long-term migraine management and possible preventive therapy. Strict return precautions for worsening headache, neurological symptoms, or systemic signs were reviewed.    Medical Decision Making      Social Drivers of Health with Concerns     Tobacco Use: High Risk (03/11/2024)    Patient History     Smoking Tobacco Use: Every Day     Smokeless Tobacco Use: Never     Passive Exposure: Not on file   Transportation Needs: Unmet Transportation Needs (03/31/2023)    PRAPARE - Transportation     Lack of Transportation (Medical): Yes     Lack of Transportation (Non-Medical): Yes   Physical Activity: Not on file   Stress: Not on file   Interpersonal Safety: Not on file   Substance Use: Not on file (10/01/2023)   Social Connections: Not on file   Health Literacy: Not on file   Internet Connectivity: Not on file       ____________________________________________         History     Chief Complaint  Headache New Onset or New Symptoms      HPI   Glenn Kennedy is a 43 y.o. male who presents to the [facility] Emergency Department for recurrent headaches, similar to prior episodes, consistent with his longstanding history of migraines. He reports that symptoms began yesterday evening with onset of a frontal, constant headache associated with photophobia and phonophobia, worsened by light and noise. He denies fever, chills, cough, neck stiffness, or visual changes. No recent trauma or neurological deficits. He has not been established on preventive therapy for migraines and typically comes to the ED when episodes become refractory. He reports no known correlation with blood pressure medications and denies carbon monoxide exposure, though he notes family members have had viral illnesses recently.    Review of Systems:   Pertinent positives and negatives are documented as per the HPI    Physical Exam     VITAL SIGNS:    ED Triage Vitals [07/25/24 2327]   Enc Vitals Group      BP 145/103      Pulse 84      SpO2 Pulse  Resp 20      Temp 35.9 ??C (96.7 ??F)      Temp Source Temporal      SpO2 98 %      Weight 95.1 kg (209 lb 9.6 oz)      Height 1.702 m (5' 7)      Head Circumference       Peak Flow       Pain Score       Pain Loc       Pain Education       Exclude from Growth Chart        Constitutional: Alert and oriented. Well appearing and in no distress.  Eyes: Conjunctivae are normal. PERRL  ENT       Head: Normocephalic and atraumatic.       Nose: No congestion.       Mouth/Throat: Mucous membranes are moist.       Neck: No stridor.  Cardiovascular: Normal rate, regular rhythm.  Respiratory: Normal respiratory effort.  Gastrointestinal: Soft, non tender, non distend, without rebound or guarding.  Musculoskeletal: No deformities.  Neurologic: Normal speech and language. No gross focal neurologic deficits are appreciated.  Skin: Skin is warm, dry and intact. No rash noted.    Pertinent labs & imaging results that were available during my care of the patient were reviewed by me and considered in my medical decision making (see chart for details).       Dann Arley Leech, MD  07/26/24 [phone]

## 2024-07-26 ENCOUNTER — Emergency Department
Admit: 2024-07-26 | Discharge: 2024-07-26 | Disposition: A | Discharge status: Home / Self Care | Payer: Medicare (Managed Care)

## 2024-07-26 MED ADMIN — ketorolac (TORADOL) injection 15 mg: 15 mg | INTRAVENOUS | @ 04:00:00 | Stop: 2024-07-25

## 2024-07-26 MED ADMIN — prochlorperazine (COMPAZINE) injection 10 mg: 10 mg | INTRAVENOUS | @ 04:00:00 | Stop: 2024-07-25

## 2024-07-26 MED ADMIN — diphenhydrAMINE (BENADRYL) injection 25 mg: 25 mg | INTRAVENOUS | @ 04:00:00 | Stop: 2024-07-25

## 2024-07-30 ENCOUNTER — Encounter: Admit: 2024-07-30 | Discharge: 2024-07-30 | Payer: Medicare (Managed Care)

## 2024-07-30 DIAGNOSIS — E785 Hyperlipidemia, unspecified: Principal | ICD-10-CM

## 2024-07-30 DIAGNOSIS — G43709 Chronic migraine without aura, not intractable, without status migrainosus: Principal | ICD-10-CM

## 2024-07-30 DIAGNOSIS — M542 Cervicalgia: Principal | ICD-10-CM

## 2024-07-30 DIAGNOSIS — G8929 Other chronic pain: Principal | ICD-10-CM

## 2024-07-30 DIAGNOSIS — I10 Essential (primary) hypertension: Principal | ICD-10-CM

## 2024-07-30 LAB — BASIC METABOLIC PANEL
ANION GAP: 10 mmol/L (ref 5–14)
BLOOD UREA NITROGEN: 9 mg/dL (ref 9–23)
BUN / CREAT RATIO: 9
CALCIUM: 9.4 mg/dL (ref 8.7–10.4)
CHLORIDE: 108 mmol/L — ABNORMAL HIGH (ref 98–107)
CO2: 23.9 mmol/L (ref 20.0–31.0)
CREATININE: 1.01 mg/dL (ref 0.73–1.18)
EGFR CKD-EPI (2021) MALE: >90 mL/min/1.73m2 (ref >=60)
GLUCOSE RANDOM: 103 mg/dL (ref 70–179)
POTASSIUM: 3.9 mmol/L (ref 3.4–4.8)
SODIUM: 142 mmol/L (ref 135–145)

## 2024-07-30 MED ORDER — KETOROLAC 10 MG TABLET
ORAL_TABLET | Freq: Two times a day (BID) | ORAL | 0 refills | 10.00000 days | Status: CP | PRN
Start: 2024-07-30 — End: ?

## 2024-07-30 MED ORDER — RIZATRIPTAN 10 MG DISINTEGRATING TABLET
ORAL_TABLET | Freq: Once | ORAL | 3 refills | 45.00000 days | Status: CP | PRN
Start: 2024-07-30 — End: 2025-07-30

## 2024-07-30 MED ORDER — AMLODIPINE 10 MG-VALSARTAN 160 MG TABLET
ORAL_TABLET | Freq: Every day | ORAL | 3 refills | 90.00000 days | Status: CP
Start: 2024-07-30 — End: ?

## 2024-07-30 MED ORDER — ROSUVASTATIN 5 MG TABLET
ORAL_TABLET | Freq: Every evening | ORAL | 3 refills | 90.00000 days | Status: CP
Start: 2024-07-30 — End: 2025-07-30

## 2024-07-30 NOTE — Unmapped (Addendum)
 Please bring all of your medications to your next visit so I can see what you have.     Please restart your blood pressure medication called amlodipine -valsartan .     When you headache starts, start with a Maxalt  dissolvable tablet.  If not better in 4-6 hours, then can take a Toradol .  If not not better, come in to our same day clinic.     You will get a call to scheudle with Dr. Hezzie for botox injections if she thinks that will help

## 2024-07-30 NOTE — Unmapped (Signed)
 [facility]  Internal Medicine Clinic Visit    Reason for visit: ER follow-up, chronic headaches    A/P:    Assessment & Plan  Chronic migraine without aura with associated cervicalgia  Chronic migraines occur monthly with severe headaches lasting up to three days. Emgality  injections have been beneficial. Propranolol  is used for prevention but not effective for acute relief. High blood pressure may contribute to migraine frequency and severity.  - Continue Emgality  injections monthly.  - Refilled Maxalt  (rizatriptan ) for acute migraine relief, to be taken as early as possible when a headache is coming on.  - Prescribe Toradol  for acute migraine relief when initial treatment with maxalt  is ineffective.  - Refer to Dr. Hezzie for Botox injections for migraine prevention.  - Restart propranolol  for migraine prevention and blood pressure control.    Essential hypertension  Blood pressure elevated at 158/108 mmHg. Hypertension may contribute to migraine frequency and severity. Medication noncompliance noted. Target blood pressure is 130/70 mmHg to prevent complications.  - Send prescription for blood pressure medication to [facility] in [place]  - Monitor blood pressure regularly and aim for target levels of 130/70 mmHg.  - Order kidney function tests to assess for potential damage due to prolonged hypertension.  - reviewed that BP medication is not an abortive medication for headache.  Maxalt  should be used instead (refilled as noted above.    Hyperlipidemia  Lab Results   Component Value Date    CHOL 140 12/23/2022     Lab Results   Component Value Date    HDL 25 (L) 12/23/2022     Lab Results   Component Value Date    LDL 96 12/23/2022     Lab Results   Component Value Date    VLDL 19.4 12/23/2022     Lab Results   Component Value Date    CHOLHDLRATIO 5.6 (H) 12/23/2022     Lab Results   Component Value Date    TRIG 97 12/23/2022    The 10-year ASCVD risk score (Arnett DK, et al., 2019) is: 15.8%  - Send prescription for rosuvastatin  5mg  daily to [facility] in [place].  - repeat Lipid panel at next visit if has restarted rosuvastatin     Return in about 6 weeks (around 09/10/2024) for In-person 6-8 weeks.          __________________________________________________________    HPI:    History of Present Illness  Glenn Kennedy is a 43 year old male with hypertension who presents with persistent headaches and neck pain.    Cephalalgia (headache)  - Headaches occur once per month, lasting two to three days if untreated  - Pain originates in the occipital region and radiates to the neck and shoulders  - Over-the-counter medications are sometimes ineffective  - Sun exposure can trigger headaches  - Receives a monthly Emgality  injection for headache prevention, which reduces headache frequency  - Takes medication at onset, but is unsure which one it is.  Calls friend at his how who shows bottle of his blood pressure medication.     Cervicalgia and shoulder pain  - Neck and shoulder pain accompany headaches  - Flexeril  provides symptomatic relief  - Has used muscle relaxers provided by his mother  - Has received Toradol  injections for pain relief  - felt that botox injections helped with occipital pain and headaches.  Would like to see if he is still a candidate for Botox injections    Hypertension  -  Has not been taking antihypertensive medication regularly  - Elevated blood pressure during a recent hospital visit  - he has filled this, but had taken for headache thinking it was for his headaches.  - Has propranolol  but is unsure of its role in headache prevention and has not been taking it regularly     __________________________________________________________        Medications:  Reviewed in EPIC  __________________________________________________________    Physical Exam:   Vital Signs:  Vitals:    07/30/24 1125   BP: 158/108   BP Site: L Arm   BP Position: Sitting   BP Cuff Size: Large   Pulse: 88   Temp: 36.8 ??C (98.2 ??F)   TempSrc: Oral   SpO2: 97%   Weight: 97.8 kg (215 lb 9.6 oz)   Height: 170.2 cm (5' 7)          PTHomeBP  The patient???s Average Home Blood Pressure during the last two weeks is :   /   based on  readings             Gen: Well appearing, NAD  CV: RRR, no murmurs  Pulm: CTA bilaterally, no crackles or wheezes  Ext: No edema      PHQ-9 Score:  PHQ-9 Total Score: 3  GAD-7 Score:       Medication adherence and barriers to the treatment plan have been addressed. Opportunities to optimize healthy behaviors have been discussed. Patient / caregiver voiced understanding.

## 2024-07-30 NOTE — Unmapped (Signed)
 [facility] at [facility]     Reason for visit: Follow up    Questions / Concerns that need to be addressed: Pt states they have been having neck, shoulder, and head pain every other day. Pt would like a flu vaccine today.         Omron BPs (complete if screening BP has a systolic  > 130 or diastolic > 80)  BP#1 163/112 HR 91   BP#2 154/107 HR 87  BP#3 157/104 HR 86    Average BP 158/108 HR 88  (please note this as a comment in vitals)     PTHomeBP     Diabetes:  Regularly checking blood sugars?: no  If yes, when? Complete log for past 7 days  Date Before Breakfast After Breakfast Before Lunch After Lunch Before Dinner After Dinner Before Bed                                                                                                                                     Dexcom or Libre CGM in use? If so, pull appropriate reporting through portal (Dexcom) or EPIC order Ladoris).    HCDM reviewed and updated in Epic:    We are working to make sure all of our patients??? wishes are updated in Epic and part of that is documenting a Environmental health practitioner for each patient  A Health Care Decision Maker is someone you choose who can make health care decisions for you if you are not able - who would you most want to do this for you????  is already up to date.    HCDM (patient stated preference): Nafziger,Janice - Mother - [phone]  __________________________________________________________________________________________    SCREENINGS COMPLETED IN FLOWSHEETS      AUDIT       PHQ2       PHQ9          GAD7       COPD Assessment       Falls Risk

## 2024-08-21 DIAGNOSIS — G43709 Chronic migraine without aura, not intractable, without status migrainosus: Principal | ICD-10-CM

## 2024-08-21 MED ORDER — EMGALITY PEN 120 MG/ML SUBCUTANEOUS PEN INJECTOR
11 refills | 0.00000 days
Start: 2024-08-21 — End: ?

## 2024-08-23 MED ORDER — EMGALITY PEN 120 MG/ML SUBCUTANEOUS PEN INJECTOR
SUBCUTANEOUS | 11 refills | 0.00000 days | Status: CP
Start: 2024-08-23 — End: ?

## 2024-09-13 DIAGNOSIS — E785 Hyperlipidemia, unspecified: Principal | ICD-10-CM

## 2024-09-13 DIAGNOSIS — I10 Essential (primary) hypertension: Principal | ICD-10-CM

## 2024-09-13 MED ORDER — ROSUVASTATIN 5 MG TABLET
ORAL_TABLET | Freq: Every evening | ORAL | 3 refills | 90.00000 days | Status: CP
Start: 2024-09-13 — End: 2025-09-13

## 2024-09-13 MED ORDER — AMLODIPINE 10 MG-VALSARTAN 320 MG TABLET
ORAL_TABLET | Freq: Every day | ORAL | 3 refills | 90.00000 days | Status: CP
Start: 2024-09-13 — End: 2025-09-13

## 2024-09-13 NOTE — Patient Instructions (Addendum)
 I adjusted your blood pressure medication - same medication amlodipine -valsartan  10-320mg  once a day.     Pick up your cholesterol medication rosuvastatin .  This will help prevent heart disease for you.     I will see how you are doing in 6 weeks at your next clinic visit, which will include blood work.

## 2024-09-13 NOTE — Progress Notes (Signed)
 [facility] at [facility]     Reason for visit: Follow up    Questions / Concerns that need to be addressed: neck pain that he rates a 5     Omron BPs (complete if screening BP has a systolic  > 130 or diastolic > 80)  BP#1 134/95 80   BP#2 127/89 83  BP#3 128/89 80     Average BP 130/91 81  (please note this as a comment in vitals)     PTHomeBP         HCDM reviewed and updated in Epic:    We are working to make sure all of our patients??? wishes are updated in Epic and part of that is documenting a Environmental Health Practitioner for each patient  A Health Care Decision Maker is someone you choose who can make health care decisions for you if you are not able - who would you most want to do this for you????  is already up to date.    HCDM (patient stated preference): Glenn Kennedy,Glenn Kennedy - Mother - [phone]      Annual Screenings:   SDOH   __________________________________________________________________________________________    SCREENINGS COMPLETED IN FLOWSHEETS      AUDIT       PHQ2       PHQ9          GAD7       COPD Assessment       Falls Risk

## 2024-09-16 NOTE — Progress Notes (Signed)
 [facility] - [facility]  Internal Medicine Clinic Visit    Reason for visit: HTN, recurrent migraines    A/P:    Assessment & Plan  Hypertension  Blood pressure improved but above target. Concerns about adherence and potential white coat syndrome.  - Increase to amlodipine -valsartan  10-320 mg daily (Previously 10-160mg  daily)  - Encourage home blood pressure monitoring.  - Reinforce adherence to current medication regimen.  - follow-up in 6 weeks for HTN management - plan for repeat BMP at that time    Headache disorder  Headaches improved with blood pressure management. Maxalt  effective; ketorolac  reserved for severe cases.  -Continue Emgality  injections once a month  - Continue Maxalt  for acute headache management.  - Reserve ketorolac  for severe headaches when Maxalt  is ineffective.  - Ensure access to pain injector for severe headaches.    Hyperlipidemia  Previous high cholesterol with ASCVD risk of 11.2%. Rosuvastatin  not filled. Smoking cessation critical for reducing cardiovascular risk.  - Re-prescribe rosuvastatin  and encourage adherence.  - Discuss importance of smoking cessation in reducing cardiovascular risk.  - repeat lipid panel at his next visit if he has started Crestor .    Nicotine  dependence, cigarettes  Smokes 1.5 packs per day, not ready to quit but acknowledges need. Smoking is a significant cardiovascular risk factor.  - Continue to discuss smoking cessation at each visit.  - Encourage reduction in smoking as a step towards cessation.  - Discuss potential stress management techniques to reduce smoking triggers.  During today???s visit, I completed the following (choose corresponding billing codes from billing preference list):    ---Smoking cessation >10 minutes: I spent more than 10 minutes counseling about smoking cessation (use diagnosis code [phone])        Return in about 6 weeks (around 10/25/2024) for In-person. __________________________________________________________    HPI:    History of Present Illness  Glenn Kennedy is a 43 year old male with hypertension and arthritis who presents with concerns about his arthritis and blood pressure management.    Arthralgia and musculoskeletal pain  - Pain localized to right shoulder, neck, and back attributed to arthritis  - Intermittent hip pain  - Ketorolac  used for pain management with effective relief    Hypertension and blood pressure management  - Concern regarding blood pressure control  - Does not consistently monitor blood pressure at home  - Restarted amlodipine  10 mg and valsartan  160 mg after previous visit, with a 90-day supply  - Improvement in headaches since resuming antihypertensive medications    Tobacco use  - Smokes approximately 1.5 packs of cigarettes daily  - No attempts to quit smoking  - Triggers for smoking include caretaking for his children which is very stressful for him especially the youngest child.  - Does not smoke around his kids  - His fianc??e has also encouraged him to quit smoking  - When he first wakes up in the morning he lights up a cigarette.  - After exercise feels well and does not feel like he needs to smoke.  - Friends also smoke    Hyperlipidemia  - Total cholesterol 140 mg/dL  - Low HDL  - LDL 96 mg/dL  - Prescribed rosuvastatin  but did not fill prescription    Lifestyle and psychosocial factors  - Exercises regularly  - Sleep is adequate  - Lives with fianc??e and her three children  - Busy home environment contributes to stress     __________________________________________________________  Medications:  Reviewed in EPIC  __________________________________________________________    Physical Exam:   Vital Signs:  Vitals:    09/13/24 0950   BP: 130/91   BP Site: L Arm   BP Position: Sitting   BP Cuff Size: Medium   Pulse: 82   Temp: 36.5 ??C (97.7 ??F)   TempSrc: Temporal   SpO2: 98%   Weight: 97 kg (213 lb 12.8 oz) Height: 170.2 cm (5' 7)          PTHomeBP  The patient???s Average Home Blood Pressure during the last two weeks is :   /   based on  readings               Physical Exam  VITALS: BP- 130/91    Gen: Well appearing, NAD  CV: RRR, no murmurs  Pulm: CTA bilaterally, no crackles or wheezes      PHQ-9 Score:     GAD-7 Score:       Medication adherence and barriers to the treatment plan have been addressed. Opportunities to optimize healthy behaviors have been discussed. Patient / caregiver voiced understanding.

## 2024-09-25 NOTE — Progress Notes (Unsigned)
 NEUROLOGY REPORT    Prior visit with this Neurologist: 07/2023    Assessment/Plan:     Glenn Kennedy is a 43 y.o. male  who presents for evaluation and management with medication management of Chronic migraine without aura without status migrainosus.  He has previously done really well with prophylaxis on galcanezumab .  His headache frequency has reduced since resuming Emgality .  However,  continues to report neck pain- previously resolved with occipital nerve block which he requests again today. HIT 6 is 49.       PLAN:    Medication Management: Continue prophylaxis with monthly injectable MAB:  Emgality - refills sent in.   There is a high risk for medication side effects, which will require ongoing monitoring and dose adjustment. Medication side effect profile was reviewed in detail with patient. Medication safety and drug interactions reviewed on ERx.   HEALTH EDUCATION/HEALTH LITERACY: PATIENT EDUCATION was provided. Reviewed the plan of care in detail. The benefits and risks of each of the above procedures, benefits and alternative options were reviewed with the patient. Patient Counseling: All the patient's questions and concerns were addressed to their stated satisfaction .    If no improvement, consider PT for cervical paraspinous spasm and cervicalgia.   Courtesy refills on antihypertensive medication.     Barriers to Care: Access to care: Health literacy.      No follow-ups on file.     Subjective     SOURCE: The history is obtained from the patient who appears to be a reliable historian. Additional history is obtained from review of available medical records. Open-ended and close-ended questions, as well as review of EPIC chart, including CareEverywhere if available.    History of Present Illness:  Glenn Kennedy is a 43 y.o.  male seen in followup for ongoing evaluation and management of chronic migraine and cervicalgia.     Seen in early September by PCP, noted to:  severe headaches lasting up to three days.   Emgality  injections have been beneficial.   Propranolol  is used for prevention but not effective for acute relief.   High blood pressure may contribute to migraine frequency and severity.  - Continue Emgality  injections monthly.  - Refilled Maxalt  (rizatriptan ) for acute migraine relief, to be taken as early as possible when a headache is coming on.  - Prescribe Toradol  for acute migraine relief when initial treatment with maxalt  is ineffective.  - Refer to Dr. Hezzie for Botox injections for migraine prevention.  - Restart propranolol  for migraine prevention and blood pressure control.    He recalls this in the past- had occipital nerve block with improvement (~2017). Feels that he would benefit from a nerve block again.   No migraines. No problems with EMGALITY .     Medication History:   Also on Topiramate  100 MG BID- no sure of adherence.    Botox in the past- ? Improvement.   Emgality : April 24, 2019  resumed Emgality , since he had done really well on Emgality , with almost no headaches, until he moved to [facility] and did not receive it for 7-8 months while there. EMGALITY  approved 01/18/2019 through 04/23/2019.Got the injection last month.   Occipital nerve block- very effective.     Abortive:   BC  Excedrin  Rizatriptan  helps some.   Propanolol.     Objective     GENERAL PHYSICAL EXAM:    Vital Signs:   There were no vitals taken for this visit.  Estimated body mass index  is 33.49 kg/m?? as calculated from the following:    Height as of 09/13/24: 170.2 cm (5' 7).    Weight as of 09/13/24: 97 kg (213 lb 12.8 oz).  No height and weight on file for this encounter.    Wt Readings from Last 8 Encounters:   09/13/24 97 kg (213 lb 12.8 oz)   07/30/24 97.8 kg (215 lb 9.6 oz)   07/25/24 95.1 kg (209 lb 9.6 oz)   11/21/23 97.5 kg (215 lb)   08/09/23 97.2 kg (214 lb 3.2 oz)   07/05/23 96.6 kg (213 lb)   04/19/23 96.6 kg (213 lb)   03/31/23 96.6 kg (213 lb)     General Comments: The patient participated fully in the visit.     NEUROLOGICAL EXAMINATION   Mental Status  Speech and language were normal.    PROCEDURE NOTE: OCCIPITAL NERVE BLOCK    Indication: Occipital Neuralgia     We have discussed repeating the occipital nerve block which the patient would like to pursue.   Prior to initiation of procedure, proper consent was obtained from the patient after discussing risks and benefits of the procedure.     [facility] ClinicTime Out    [x]  Verification of correct patient, using 2 patient identifiers  [x]  Verification of correct procedure and correct site confirmed by review of medical documentation AND through verification/review with patient or responsible care giver.  [x]  Verification that all relevant documents and studies (history and physical, laboratory, pathology, radiology, etc.) are available prior to start of procedure and are matched with patient identifiers (same patient name, date of birth, medical record number).  [x]  Presence of accurately completed and signed procedure consent form.  [x]  Verification that required items for procedure (blood products, implants, devices, special equipment) are available.  [x]  Correct patient position.  [x]  Safety precautions based on patient history or medication use.  [x]  Confirmation that expiration date for items is on or after the procedure date.      Risks were including but not limited to infection and bleeding at local sites and distal to site of injections.   Patient agreed to the procedure after thorough discussion.    Using palpation and pain response, target areas in the suboccipital region, including the occipital nerve region bilaterally were identified. A timeout was performed immediately prior to the procedure, identifying patient, and location of injections.    Patient was kept in the sitting position, the occipital groove was properly identified on each side and pressure applied to determine whether reproduction of pain symptoms would occur on palpation. In this patient's case, applied pressure reproduced faithfully the symptoms he described as painful.     After proper identification of the tract of the greater occipital nerve, the sites were thoroughly and carefully cleaned with alcohol swabs in usual fashion.    A solution containing of 0.5% Sensorcaine , and 1.0 cc of DepoMedrol (80 mg) for a total of was prepared in a 5 ML syringe.     2.5 mL was applied at each of 1 site at the point of most pain, along the tract of the greater occipital nerve.    Procedure was performed bilaterally.    Patient tolerated procedure well. Minimal bleeding occurred which was easily stopped with applying local pressure. Pressure was applied to each of the injection sites with the intent of spreading the solution under the scalp.    Complications: There were no immediate complications from the procedure. Patient was  informed that she should avoid washing site of injections for approximately 4 hours, and thereafter should maintain his  hair clean in order to avoid local site infections.    .    Dr. Shasta KYM JUDITHANN Hezzie  CC: Gladman, Wanda Bumpers, MD  Dictated using voice-activated software. Despite editing, typographical errors may exist.

## 2024-11-11 NOTE — Telephone Encounter (Signed)
 PA has been submitted for Glenn Kennedy (Key: [id number])  Emgality  120MG /ML auto-injectors (migraine)
